# Patient Record
Sex: Female | Born: 1985 | Race: White | Hispanic: No | Smoking: Never smoker
Health system: Southern US, Community
[De-identification: ages and names within clinical notes are randomized; demographics above are authoritative.]

## PROBLEM LIST (undated history)

## (undated) DIAGNOSIS — J45909 Unspecified asthma, uncomplicated: Secondary | ICD-10-CM

## (undated) DIAGNOSIS — T7840XA Allergy, unspecified, initial encounter: Secondary | ICD-10-CM

## (undated) HISTORY — DX: Allergy, unspecified, initial encounter: T78.40XA

## (undated) HISTORY — DX: Unspecified asthma, uncomplicated: J45.909

---

## 2019-05-31 ENCOUNTER — Other Ambulatory Visit: Payer: Self-pay | Admitting: *Deleted

## 2019-05-31 DIAGNOSIS — Z20822 Contact with and (suspected) exposure to covid-19: Secondary | ICD-10-CM

## 2019-06-01 LAB — NOVEL CORONAVIRUS, NAA: SARS-CoV-2, NAA: NOT DETECTED

## 2020-07-07 NOTE — L&D Delivery Note (Signed)
Delivery Note Labor onset: 05/21/2021  Labor Onset Time: 1900 Complete dilation at 11:21 PM  Onset of pushing at 2345 FHR second stage Cat 1 Analgesia/Anesthesia intrapartum: Epidural  Guided pushing with maternal urge. Delivery of a viable female at 81. Fetal head delivered in ROP position.  Nuchal cord: none.  Infant placed on maternal abd, dried, and tactile stim.  Cord double clamped after 3 min and cut by Brenda Estrada, father.  Brenda Estrada and Brenda Estrada (pt's mother) present for birth.  Cord blood sample collected: Yes Arterial cord blood sample collected: No  Placenta delivered Brenda Estrada, intact, with 3 VC.  Placenta to L&D. Uterine tone firm, bleeding brisk, resolved with massage  1st degree laceration identified.  Anesthesia: epidural Repair: 4-0 Vicryl SH QBL/EBL (mL): 250 Complications: none APGAR: APGAR (1 MIN): 9   APGAR (5 MINS): 9   APGAR (10 MINS):   Mom to postpartum.  Baby to Couplet care / Skin to Skin Baby boy "Simonne Come" parents request in-pt circ  Brenda Schanz MSN, CNM 05/22/2021, 12:51 AM

## 2020-09-27 ENCOUNTER — Other Ambulatory Visit: Payer: Self-pay

## 2020-09-27 ENCOUNTER — Ambulatory Visit (INDEPENDENT_AMBULATORY_CARE_PROVIDER_SITE_OTHER): Payer: Medicaid Other

## 2020-09-27 VITALS — BP 120/79 | HR 69 | Ht 70.0 in | Wt 163.0 lb

## 2020-09-27 DIAGNOSIS — Z3A01 Less than 8 weeks gestation of pregnancy: Secondary | ICD-10-CM

## 2020-09-27 DIAGNOSIS — Z3201 Encounter for pregnancy test, result positive: Secondary | ICD-10-CM | POA: Diagnosis not present

## 2020-09-27 LAB — POCT PREGNANCY, URINE: Preg Test, Ur: POSITIVE — AB

## 2020-09-27 NOTE — Progress Notes (Signed)
Pt here today for UPT. UPT positive in office today. Pt aware of results.  LMP 08/06/20, exact  EDD 05/09/21 [redacted]w[redacted]d today based on LMP.   Pt denies any vaginal bleeding, spotting, abd pain or cramps. Pt had nausea and vomiting x 1 yesterday. Pt given pregnancy safe medication list.   Gerrit Heck, CNM in to speak with pt for NEW OB + UPT.   Judeth Cornfield, RN  09/27/20.

## 2020-09-27 NOTE — Progress Notes (Signed)
  History:  [name] is a 35 y.o. G1P0 who presents to clinic today with complaint of amenorrhea. She had a UPT today, in office, that confirms positive and patient reports her Definite LMP was Aug 06, 2020 with EDD of 05/13/2021.  She has questions regarding consumption of seafood and reports some mild nausea.    History reviewed. No pertinent past medical history.  History reviewed. No pertinent surgical history.  The following portions of the patient's history were reviewed and updated as appropriate: allergies, current medications, past family history, past medical history, past social history, past surgical history and problem list.   Review of Systems:  Pertinent items noted in HPI and remainder of comprehensive ROS otherwise negative.  Objective:  Physical Exam BP 120/79   Pulse 69   Ht 5\' 10"  (1.778 m)   Wt 163 lb (73.9 kg)   LMP 08/06/2020 (Exact Date)   BMI 23.39 kg/m  Physical Exam Constitutional:      Appearance: Normal appearance.  HENT:     Head: Normocephalic and atraumatic.  Eyes:     Conjunctiva/sclera: Conjunctivae normal.  Pulmonary:     Effort: Pulmonary effort is normal. No respiratory distress.  Musculoskeletal:        General: Normal range of motion.  Neurological:     Mental Status: She is alert and oriented to person, place, and time.  Psychiatric:        Mood and Affect: Mood normal.        Behavior: Behavior normal.        Thought Content: Thought content normal.     Labs and Imaging Results for orders placed or performed in visit on 09/27/20 (from the past 24 hour(s))  Pregnancy, urine POC     Status: Abnormal   Collection Time: 09/27/20 10:35 AM  Result Value Ref Range   Preg Test, Ur POSITIVE (A) NEGATIVE   No results found.   Assessment & Plan:  1. Positive pregnancy test -Encouraged to start using PNV if not already. -List provided of community [facility] for initiation of care.  2. [redacted] weeks gestation of pregnancy -Encouraged  to eat 3 meals daily and consume high protein snacks between meals. -Informed that consumption of seafood is acceptable in moderation/occasional usage. -Cautioned that primary ob provider may give other recommendations and she should be considerate of those. -Bleeding precautions given   Approximately 15 minutes of total time was spent with this patient on history taking, coordination of care, education and documentation.   09/29/20, CNM 09/27/2020 11:42 AM

## 2020-09-27 NOTE — Patient Instructions (Signed)
Prenatal Care Providers           [facility]  [address] [phone]  [facility]   [address]  [phone]  [facility]       [address] [phone]            [facility]     [address] [phone]          [facility] [place]   [address] [phone]  [facility]  [address] [phone]     [facility]  [address]   [phone]     [facility]  Phone: [phone]  [facility]  Phone: [phone]  [facility] Phone: [phone]  [facility] Phone: [phone]  [facility] Phone: [phone]  [facility] Phone: [phone]  [facility]  Phone: [phone]  

## 2020-10-26 LAB — OB RESULTS CONSOLE HEPATITIS B SURFACE ANTIGEN: Hepatitis B Surface Ag: NEGATIVE

## 2020-10-26 LAB — OB RESULTS CONSOLE RPR: RPR: NONREACTIVE

## 2020-10-26 LAB — OB RESULTS CONSOLE RUBELLA ANTIBODY, IGM: Rubella: IMMUNE

## 2020-10-26 LAB — OB RESULTS CONSOLE ABO/RH: RH Type: POSITIVE

## 2020-10-29 LAB — OB RESULTS CONSOLE GC/CHLAMYDIA
Chlamydia: NEGATIVE
Gonorrhea: NEGATIVE

## 2021-02-18 LAB — OB RESULTS CONSOLE HIV ANTIBODY (ROUTINE TESTING): HIV: NONREACTIVE

## 2021-03-04 ENCOUNTER — Other Ambulatory Visit: Payer: Self-pay

## 2021-03-04 ENCOUNTER — Inpatient Hospital Stay (HOSPITAL_COMMUNITY)
Admission: AD | Admit: 2021-03-04 | Discharge: 2021-03-04 | Disposition: A | Discharge status: Home / Self Care | Payer: Medicaid Other | Attending: Obstetrics and Gynecology | Admitting: Obstetrics and Gynecology

## 2021-03-04 ENCOUNTER — Inpatient Hospital Stay (HOSPITAL_COMMUNITY): Payer: Medicaid Other

## 2021-03-04 ENCOUNTER — Encounter (HOSPITAL_COMMUNITY): Payer: Self-pay | Admitting: Obstetrics and Gynecology

## 2021-03-04 DIAGNOSIS — Z881 Allergy status to other antibiotic agents status: Secondary | ICD-10-CM | POA: Diagnosis not present

## 2021-03-04 DIAGNOSIS — U071 COVID-19: Secondary | ICD-10-CM | POA: Diagnosis not present

## 2021-03-04 DIAGNOSIS — O98513 Other viral diseases complicating pregnancy, third trimester: Secondary | ICD-10-CM | POA: Diagnosis not present

## 2021-03-04 DIAGNOSIS — Z3A30 30 weeks gestation of pregnancy: Secondary | ICD-10-CM | POA: Diagnosis not present

## 2021-03-04 DIAGNOSIS — R059 Cough, unspecified: Secondary | ICD-10-CM | POA: Diagnosis not present

## 2021-03-04 DIAGNOSIS — O09513 Supervision of elderly primigravida, third trimester: Secondary | ICD-10-CM | POA: Diagnosis not present

## 2021-03-04 DIAGNOSIS — O26893 Other specified pregnancy related conditions, third trimester: Secondary | ICD-10-CM | POA: Diagnosis present

## 2021-03-04 DIAGNOSIS — R0602 Shortness of breath: Secondary | ICD-10-CM

## 2021-03-04 DIAGNOSIS — R509 Fever, unspecified: Secondary | ICD-10-CM | POA: Insufficient documentation

## 2021-03-04 DIAGNOSIS — R5383 Other fatigue: Secondary | ICD-10-CM | POA: Insufficient documentation

## 2021-03-04 LAB — COMPREHENSIVE METABOLIC PANEL
ALT: 15 U/L (ref 0–44)
AST: 23 U/L (ref 15–41)
Albumin: 2.9 g/dL — ABNORMAL LOW (ref 3.5–5.0)
Alkaline Phosphatase: 83 U/L (ref 38–126)
Anion gap: 4 — ABNORMAL LOW (ref 5–15)
BUN: <5 mg/dL — ABNORMAL LOW (ref 6–20)
CO2: 26 mmol/L (ref 22–32)
Calcium: 8.3 mg/dL — ABNORMAL LOW (ref 8.9–10.3)
Chloride: 104 mmol/L (ref 98–111)
Creatinine, Ser: 0.58 mg/dL (ref 0.44–1.00)
GFR, Estimated: >60 mL/min (ref >60)
Glucose, Bld: 89 mg/dL (ref 70–99)
Potassium: 3.9 mmol/L (ref 3.5–5.1)
Sodium: 134 mmol/L — ABNORMAL LOW (ref 135–145)
Total Bilirubin: 0.5 mg/dL (ref 0.3–1.2)
Total Protein: 6.3 g/dL — ABNORMAL LOW (ref 6.5–8.1)

## 2021-03-04 LAB — CBC
HCT: 34.7 % — ABNORMAL LOW (ref 36.0–46.0)
Hemoglobin: 11.8 g/dL — ABNORMAL LOW (ref 12.0–15.0)
MCH: 33.3 pg (ref 26.0–34.0)
MCHC: 34 g/dL (ref 30.0–36.0)
MCV: 98 fL (ref 80.0–100.0)
Platelets: 286 10*3/uL (ref 150–400)
RBC: 3.54 MIL/uL — ABNORMAL LOW (ref 3.87–5.11)
RDW: 11.9 % (ref 11.5–15.5)
WBC: 7.8 10*3/uL (ref 4.0–10.5)
nRBC: 0 % (ref 0.0–0.2)

## 2021-03-04 LAB — URINALYSIS, ROUTINE W REFLEX MICROSCOPIC
Bilirubin Urine: NEGATIVE
Glucose, UA: NEGATIVE mg/dL
Hgb urine dipstick: NEGATIVE
Ketones, ur: NEGATIVE mg/dL
Leukocytes,Ua: NEGATIVE
Nitrite: NEGATIVE
Protein, ur: NEGATIVE mg/dL
Specific Gravity, Urine: 1.006 (ref 1.005–1.030)
pH: 7 (ref 5.0–8.0)

## 2021-03-04 LAB — C-REACTIVE PROTEIN: CRP: 0.7 mg/dL (ref <1.0)

## 2021-03-04 LAB — D-DIMER, QUANTITATIVE: D-Dimer, Quant: 0.82 ug/mL-FEU — ABNORMAL HIGH (ref 0.00–0.50)

## 2021-03-04 MED ORDER — ALBUTEROL SULFATE HFA 108 (90 BASE) MCG/ACT IN AERS
2.0000 | INHALATION_SPRAY | Freq: Once | RESPIRATORY_TRACT | Status: DC
  Filled 2021-03-04: qty 6.7

## 2021-03-04 MED ORDER — BENZONATATE 100 MG PO CAPS
200.0000 mg | ORAL_CAPSULE | Freq: Once | ORAL | Status: AC
  Administered 2021-03-04: 200 mg via ORAL
  Filled 2021-03-04: qty 2

## 2021-03-04 MED ORDER — ALBUTEROL SULFATE HFA 108 (90 BASE) MCG/ACT IN AERS: 2.0000 | INHALATION_SPRAY | RESPIRATORY_TRACT | 0 refills | Status: AC | PRN

## 2021-03-04 MED ORDER — DEXAMETHASONE 6 MG PO TABS
6.0000 mg | ORAL_TABLET | Freq: Once | ORAL | Status: AC
  Administered 2021-03-04: 6 mg via ORAL
  Filled 2021-03-04: qty 1

## 2021-03-04 MED ORDER — DEXAMETHASONE 6 MG PO TABS: 6.0000 mg | ORAL_TABLET | Freq: Every day | ORAL | 0 refills | Status: AC

## 2021-03-04 NOTE — MAU Note (Signed)
Pt reports COVID + on 02/27/2021. Pt reports symptoms have not resolved. Pt reports that she is coughing a lot at night and is unable to breathe well or catch her breath during those times. Pt reports she feels like has drainage in her chest and has felt at times like she was going to choke.    Denies vaginal bleeding or LOF.   Reports +FM

## 2021-03-04 NOTE — MAU Provider Note (Signed)
History     CSN: [id number]  Arrival date and time: 03/04/21 1347    No chief complaint on file.  HPI  Is a 35 year old G1 P0 at 30 weeks and 0 days.  Patient here for shortness of breath and COVID-like symptoms.  She tested positive on 8/24 when her symptoms started.  She has been having coughing with some shortness of breath.  Her breathing is worse when trying to lay down flat, especially she feels that there is a lot of drainage from her sinuses.  She has fatigue, diminished appetite, some fevers. No other palliating or provoking factors. Her symptoms are worsening.  OB History     Gravida  1   Para      Term      Preterm      AB      Living         SAB      IAB      Ectopic      Multiple      Live Births              History reviewed. No pertinent past medical history.  History reviewed. No pertinent surgical history.  History reviewed. No pertinent family history.  Social History   Tobacco Use   Smoking status: Never   Smokeless tobacco: Never  Vaping Use   Vaping Use: Never used  Substance Use Topics   Alcohol use: Never   Drug use: Never    Allergies:  Allergies  Allergen Reactions   Erythromycin Hives    Also vomiting     No medications prior to admission.    Review of Systems  All other systems reviewed and are negative. Physical Exam   Temperature 98.9 F (37.2 C), resp. rate 12, last menstrual period 08/06/2020, SpO2 95 %.  Physical Exam Vitals and nursing note reviewed.  Constitutional:      Appearance: Normal appearance.  Cardiovascular:     Rate and Rhythm: Normal rate and regular rhythm.  Pulmonary:     Effort: Pulmonary effort is normal.     Breath sounds: Normal breath sounds.  Abdominal:     General: Abdomen is flat.     Palpations: Abdomen is soft.  Skin:    General: Skin is warm and dry.     Capillary Refill: Capillary refill takes less than 2 seconds.  Neurological:     General: No focal deficit  present.     Mental Status: She is alert.  Psychiatric:        Mood and Affect: Mood normal.        Behavior: Behavior normal.        Thought Content: Thought content normal.   Results for orders placed or performed during the hospital encounter of 03/04/21 (from the past 24 hour(s))  Comprehensive metabolic panel     Status: Abnormal   Collection Time: 03/04/21  2:02 PM  Result Value Ref Range   Sodium 134 (L) 135 - 145 mmol/L   Potassium 3.9 3.5 - 5.1 mmol/L   Chloride 104 98 - 111 mmol/L   CO2 26 22 - 32 mmol/L   Glucose, Bld 89 70 - 99 mg/dL   BUN <5 (L) 6 - 20 mg/dL   Creatinine, Ser 5.17 0.44 - 1.00 mg/dL   Calcium 8.3 (L) 8.9 - 10.3 mg/dL   Total Protein 6.3 (L) 6.5 - 8.1 g/dL   Albumin 2.9 (L) 3.5 - 5.0 g/dL   AST 23 15 -  41 U/L   ALT 15 0 - 44 U/L   Alkaline Phosphatase 83 38 - 126 U/L   Total Bilirubin 0.5 0.3 - 1.2 mg/dL   GFR, Estimated >20 >25 mL/min   Anion gap 4 (L) 5 - 15  CBC     Status: Abnormal   Collection Time: 03/04/21  2:02 PM  Result Value Ref Range   WBC 7.8 4.0 - 10.5 K/uL   RBC 3.54 (L) 3.87 - 5.11 MIL/uL   Hemoglobin 11.8 (L) 12.0 - 15.0 g/dL   HCT 42.7 (L) 06.2 - 37.6 %   MCV 98.0 80.0 - 100.0 fL   MCH 33.3 26.0 - 34.0 pg   MCHC 34.0 30.0 - 36.0 g/dL   RDW 28.3 15.1 - 76.1 %   Platelets 286 150 - 400 K/uL   nRBC 0.0 0.0 - 0.2 %  C-reactive protein     Status: None   Collection Time: 03/04/21  2:02 PM  Result Value Ref Range   CRP 0.7 <1.0 mg/dL  D-dimer, quantitative     Status: Abnormal   Collection Time: 03/04/21  2:02 PM  Result Value Ref Range   D-Dimer, Quant 0.82 (H) 0.00 - 0.50 ug/mL-FEU  Urinalysis, Routine w reflex microscopic Urine, Clean Catch     Status: None   Collection Time: 03/04/21  2:49 PM  Result Value Ref Range   Color, Urine YELLOW YELLOW   APPearance CLEAR CLEAR   Specific Gravity, Urine 1.006 1.005 - 1.030   pH 7.0 5.0 - 8.0   Glucose, UA NEGATIVE NEGATIVE mg/dL   Hgb urine dipstick NEGATIVE NEGATIVE    Bilirubin Urine NEGATIVE NEGATIVE   Ketones, ur NEGATIVE NEGATIVE mg/dL   Protein, ur NEGATIVE NEGATIVE mg/dL   Nitrite NEGATIVE NEGATIVE   Leukocytes,Ua NEGATIVE NEGATIVE    DG CHEST PORT 1 VIEW  Result Date: 03/04/2021 CLINICAL DATA:  Shortness of breath, COVID-19 positive. EXAM: PORTABLE CHEST 1 VIEW COMPARISON:  None. FINDINGS: The heart size and mediastinal contours are within normal limits. Both lungs are clear. The visualized skeletal structures are unremarkable. IMPRESSION: No active disease. Electronically Signed   By: Lupita Raider M.D.   On: 03/04/2021 14:43     MAU Course  Procedures NST: Baseline 140, moderate variability, no accels, no decels  MDM CXR reviewed by me - no acute cardiopulmonary process. Decadron given.  Assessment and Plan   1. [redacted] weeks gestation of pregnancy   2. SOB (shortness of breath)   3. COVID-19   4. Acute COVID-19    Discharge to home. 10 days of decadron due to SOB. Will also give HFA. Patient at end of range for Paxlovid - it was offered, but she declined it.   Return for worsening SOB, unable to take medications, decreased fetal movement.  Levie Heritage 03/04/2021, 4:26 PM

## 2021-04-15 LAB — OB RESULTS CONSOLE GBS: GBS: NEGATIVE

## 2021-05-09 ENCOUNTER — Other Ambulatory Visit: Payer: Self-pay | Admitting: Obstetrics & Gynecology

## 2021-05-13 ENCOUNTER — Inpatient Hospital Stay (HOSPITAL_COMMUNITY): Admit: 2021-05-13 | Payer: Self-pay

## 2021-05-19 NOTE — H&P (Signed)
Konica Stankowski is a 35 y.o. female, G1P0000, IUP at 41 weeks, presenting for IOL for postdates.  Pt endorse + Fm. Denies vaginal leakage. Denies vaginal bleeding. Denies feeling cxt's.    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 Patient Active Problem List   Diagnosis Date Noted   Post-dates pregnancy 05/20/2021     Active Ambulatory Problems    Diagnosis Date Noted   No Active Ambulatory Problems   Resolved Ambulatory Problems    Diagnosis Date Noted   No Resolved Ambulatory Problems   No Additional Past Medical History      Medications Prior to Admission  Medication Sig Dispense Refill Last Dose   cholecalciferol (VITAMIN D3) 25 MCG (1000 UNIT) tablet Take 1,000 Units by mouth daily.   05/20/2021   Prenatal Vit-Fe Fumarate-FA (PRENATAL MULTIVITAMIN) TABS tablet Take 1 tablet by mouth daily at 12 noon.   05/20/2021   albuterol (VENTOLIN HFA) 108 (90 Base) MCG/ACT inhaler Inhale 2 puffs into the lungs every 4 (four) hours as needed for wheezing or shortness of breath. 18 g 0     History reviewed. No pertinent past medical history.   No current facility-administered medications on file prior to encounter.   Current Outpatient Medications on File Prior to Encounter  Medication Sig Dispense Refill   cholecalciferol (VITAMIN D3) 25 MCG (1000 UNIT) tablet Take 1,000 Units by mouth daily.     Prenatal Vit-Fe Fumarate-FA (PRENATAL MULTIVITAMIN) TABS tablet Take 1 tablet by mouth daily at 12 noon.     albuterol (VENTOLIN HFA) 108 (90 Base) MCG/ACT inhaler Inhale 2 puffs into the lungs every 4 (four) hours as needed for wheezing or shortness of breath. 18 g 0     Allergies  Allergen Reactions   Erythromycin Hives    Also vomiting     History of  present pregnancy: Pt Info/Preference:  Screening/Consents:  Labs:   EDD: Estimated Date of Delivery: 05/13/21  Establised: Patient's last menstrual period was 08/06/2020 (exact date).  Anatomy Scan: Date: 12/25/2020 Placenta Location: anterior Genetic Screen: Panoroma:LR female AFP:  First Tri: Quad:  Office: [facility]             First PNV: 11.4 weeks Blood Type --/--/B POS (11/14 1603)B+  Language: english Last PNV: 40.1 weeks Rhogam  N/A  Flu Vaccine:  declined   Antibody NEG (11/14 1603)Neg  TDaP vaccine UTD   GTT: Early: 501 Third Trimester: 96  Feeding Plan: breast BTL: no Rubella: Immune (04/22 0000)Immune  Contraception: ??? VBAC: no RPR: Nonreactive (04/22 0000) NR  Circumcision: ???   HBsAg: Negative (04/22 0000)Neg  Pediatrician:  ???   HIV: Non-reactive (08/15 0000) Neg  Prenatal Classes: No Additional [place]: 11/8 NST reactive, 10/10 vertex, anterior, AFI 15.1cm, 7.4lbs 91% GBS: Negative/-- (10/10 0000)04/15/2021 Negative (For PCN allergy, check sensitivities)       Chlamydia: neg    MFM Referral/Consult:  GC: neg  Support Person: husband   PAP: ???  Pain Management: epidural Neonatologist Referral:  Hgb Electrophoresis:  AA  Birth Plan: DCC   Hgb NOB: 12.9    28W: 11.5   OB History     Gravida  1   Para      Term      Preterm      AB  Living         SAB      IAB      Ectopic      Multiple      Live Births             History reviewed. No pertinent past medical history. History reviewed. No pertinent surgical history. Family History: family history is not on file. Social History:  reports that she has never smoked. She has never used smokeless tobacco. She reports that she does not drink alcohol and does not use drugs.   Prenatal Transfer Tool  Maternal Diabetes: No Genetic Screening: Normal Maternal Ultrasounds/Referrals: Normal Fetal Ultrasounds or other Referrals:  None Maternal Substance Abuse:  No Significant Maternal Medications:   None Significant Maternal Lab Results: Group B Strep negative  ROS:  Review of Systems  Constitutional: Negative.   HENT: Negative.    Eyes: Negative.   Respiratory: Negative.    Cardiovascular: Negative.   Gastrointestinal: Negative.   Genitourinary: Negative.   Musculoskeletal: Negative.   Skin: Negative.   Neurological: Negative.   Endo/Heme/Allergies: Negative.   Psychiatric/Behavioral: Negative.      Physical Exam: BP 112/69   Pulse 68   Temp 97.6 F (36.4 C) (Oral)   Resp 16   Ht 5\' 10"  (1.778 m)   Wt 98.7 kg   LMP 08/06/2020 (Exact Date)   BMI 31.22 kg/m   Physical Exam Vitals and nursing note reviewed.  Constitutional:      Appearance: Normal appearance.  HENT:     Head: Normocephalic.     Nose: Nose normal.     Mouth/Throat:     Mouth: Mucous membranes are moist.  Eyes:     Conjunctiva/sclera: Conjunctivae normal.  Cardiovascular:     Rate and Rhythm: Normal rate and regular rhythm.     Pulses: Normal pulses.     Heart sounds: Normal heart sounds.  Pulmonary:     Effort: Pulmonary effort is normal.     Breath sounds: Normal breath sounds.  Abdominal:     General: Bowel sounds are normal.  Genitourinary:    Comments: Uterus gravida, pelvis adequate for vaginal delivery  Musculoskeletal:        General: Normal range of motion.     Cervical back: Normal range of motion and neck supple.  Skin:    General: Skin is warm.     Capillary Refill: Capillary refill takes less than 2 seconds.  Neurological:     General: No focal deficit present.     Mental Status: She is alert.  Psychiatric:        Mood and Affect: Mood normal.     NST: FHR baseline 150 bpm, Variability: moderate, Accelerations:present, Decelerations:  Absent= Cat 1/Reactive UC:   OCC SVE:   Dilation: Fingertip Effacement (%): Thick Station: -3 Exam by:: [email or web address] RN, vertex verified by fetal sutures.  Leopold's: Position vertex, EFW 9lbs via leopold's.   Labs: Results for  orders placed or performed during the hospital encounter of 05/20/21 (from the past 24 hour(s))  CBC     Status: Abnormal   Collection Time: 05/20/21  4:03 PM  Result Value Ref Range   WBC 11.0 (H) 4.0 - 10.5 K/uL   RBC 3.40 (L) 3.87 - 5.11 MIL/uL   Hemoglobin 11.2 (L) 12.0 - 15.0 g/dL   HCT 05/22/21 (L) 60.7 - 37.1 %   MCV 97.1 80.0 - 100.0 fL   MCH 32.9 26.0 - 34.0 pg   MCHC  33.9 30.0 - 36.0 g/dL   RDW 57.8 46.9 - 62.9 %   Platelets 309 150 - 400 K/uL   nRBC 0.0 0.0 - 0.2 %  Type and screen     Status: None   Collection Time: 05/20/21  4:03 PM  Result Value Ref Range   ABO/RH(D) B POS    Antibody Screen NEG    Sample Expiration      05/23/2021,2359 Performed at [facility], [address] [place], [place] [address]     Imaging:  Office [place] 04/15/21: Vertex presentation normal AFI EFW 3297 grams 7 # 4 oz 91%   Assessment/Plan: Kenyanna Grzesiak is a 35 y.o. female, G1P0000, IUP at 41 weeks, presenting for IOL for postdates. Pt is AMA (35 yo, LR panoroma), ankylosis spondylitis, asthma albuterol PRN, LGA, vit D def 19.3 recommended 4000IU daily, chiari malformation (Has imaging, Chiari 1. No issues.). EFW 91% 7.6 lbs at 36 weeks. Pt endorse + Fm. Denies vaginal leakage. Denies vaginal bleeding. Denies feeling cxt's.  FWB: Cat 1 Fetal Tracing.   I discussed with patient risks, benefits and alternatives of labor induction including higher risk of cesarean delivery compared to spontaneous labor.  We discussed risks of induction agents including effects on fetal heart beat, contraction pattern and need for close monitoring.  Patient expressed understanding of all this and desired to proceed with the induction. Risks and benefits of induction were reviewed, including failure of method, prolonged labor, need for further intervention, risk of cesarean.  Patient and family verbalized understanding and denies any further questions at this time. Pt and family wish to proceed with induction  process. Discussed induction options of Cytotec, foley bulb, AROM, and pitocin were reviewed as well as risks and benefits with use of each discussed.  Plan: Admit to Birthing Suite  Routine [facility] orders Pain med/epidural prn Start with Cytotec for cervical ripening.  Anticipate labor progression   Essie Hart MD 05/20/2021, 1736 PM

## 2021-05-20 ENCOUNTER — Encounter (HOSPITAL_COMMUNITY): Payer: Self-pay | Admitting: Obstetrics & Gynecology

## 2021-05-20 ENCOUNTER — Other Ambulatory Visit: Payer: Self-pay

## 2021-05-20 ENCOUNTER — Inpatient Hospital Stay (HOSPITAL_COMMUNITY): Payer: Medicaid Other

## 2021-05-20 ENCOUNTER — Inpatient Hospital Stay (HOSPITAL_COMMUNITY)
Admission: AD | Admit: 2021-05-20 | Discharge: 2021-05-24 | DRG: 805 | Disposition: A | Discharge status: Home / Self Care | Payer: Medicaid Other | Attending: Obstetrics & Gynecology | Admitting: Obstetrics & Gynecology

## 2021-05-20 DIAGNOSIS — O480 Post-term pregnancy: Principal | ICD-10-CM | POA: Diagnosis present

## 2021-05-20 DIAGNOSIS — M459 Ankylosing spondylitis of unspecified sites in spine: Secondary | ICD-10-CM | POA: Diagnosis present

## 2021-05-20 DIAGNOSIS — Z20822 Contact with and (suspected) exposure to covid-19: Secondary | ICD-10-CM | POA: Diagnosis present

## 2021-05-20 DIAGNOSIS — O99354 Diseases of the nervous system complicating childbirth: Secondary | ICD-10-CM | POA: Diagnosis present

## 2021-05-20 DIAGNOSIS — G935 Compression of brain: Secondary | ICD-10-CM | POA: Diagnosis present

## 2021-05-20 DIAGNOSIS — O99892 Other specified diseases and conditions complicating childbirth: Secondary | ICD-10-CM | POA: Diagnosis present

## 2021-05-20 DIAGNOSIS — J45909 Unspecified asthma, uncomplicated: Secondary | ICD-10-CM | POA: Diagnosis present

## 2021-05-20 DIAGNOSIS — O3663X0 Maternal care for excessive fetal growth, third trimester, not applicable or unspecified: Secondary | ICD-10-CM | POA: Diagnosis present

## 2021-05-20 DIAGNOSIS — Z3A41 41 weeks gestation of pregnancy: Secondary | ICD-10-CM

## 2021-05-20 DIAGNOSIS — O9952 Diseases of the respiratory system complicating childbirth: Secondary | ICD-10-CM | POA: Diagnosis present

## 2021-05-20 LAB — CBC
HCT: 33 % — ABNORMAL LOW (ref 36.0–46.0)
Hemoglobin: 11.2 g/dL — ABNORMAL LOW (ref 12.0–15.0)
MCH: 32.9 pg (ref 26.0–34.0)
MCHC: 33.9 g/dL (ref 30.0–36.0)
MCV: 97.1 fL (ref 80.0–100.0)
Platelets: 309 10*3/uL (ref 150–400)
RBC: 3.4 MIL/uL — ABNORMAL LOW (ref 3.87–5.11)
RDW: 11.9 % (ref 11.5–15.5)
WBC: 11 10*3/uL — ABNORMAL HIGH (ref 4.0–10.5)
nRBC: 0 % (ref 0.0–0.2)

## 2021-05-20 LAB — RAPID HIV SCREEN (HIV 1/2 AB+AG)
HIV 1/2 Antibodies: NONREACTIVE
HIV-1 P24 Antigen - HIV24: NONREACTIVE

## 2021-05-20 LAB — TYPE AND SCREEN
ABO/RH(D): B POS
Antibody Screen: NEGATIVE

## 2021-05-20 LAB — RESP PANEL BY RT-PCR (FLU A&B, COVID) ARPGX2
Influenza A by PCR: NEGATIVE
Influenza B by PCR: NEGATIVE
SARS Coronavirus 2 by RT PCR: NEGATIVE

## 2021-05-20 MED ORDER — FENTANYL CITRATE (PF) 100 MCG/2ML IJ SOLN
50.0000 ug | INTRAMUSCULAR | Status: DC | PRN
Start: 2021-05-20 — End: 2021-05-22
  Administered 2021-05-21 (×2): 100 ug via INTRAVENOUS
  Filled 2021-05-20 (×2): qty 2

## 2021-05-20 MED ORDER — SOD CITRATE-CITRIC ACID 500-334 MG/5ML PO SOLN: 30.0000 mL | ORAL | Status: DC | PRN

## 2021-05-20 MED ORDER — LACTATED RINGERS IV SOLN
500.0000 mL | INTRAVENOUS | Status: DC | PRN
Start: 2021-05-20 — End: 2021-05-22

## 2021-05-20 MED ORDER — OXYTOCIN-SODIUM CHLORIDE 30-0.9 UT/500ML-% IV SOLN
2.5000 [IU]/h | INTRAVENOUS | Status: DC
  Administered 2021-05-22: 2.5 [IU]/h via INTRAVENOUS

## 2021-05-20 MED ORDER — TERBUTALINE SULFATE 1 MG/ML IJ SOLN: 0.2500 mg | Freq: Once | INTRAMUSCULAR | Status: DC | PRN

## 2021-05-20 MED ORDER — OXYTOCIN BOLUS FROM INFUSION
333.0000 mL | Freq: Once | INTRAVENOUS | Status: AC
  Administered 2021-05-22: 333 mL via INTRAVENOUS

## 2021-05-20 MED ORDER — LIDOCAINE HCL (PF) 1 % IJ SOLN: 30.0000 mL | INTRAMUSCULAR | Status: DC | PRN

## 2021-05-20 MED ORDER — LACTATED RINGERS IV SOLN: INTRAVENOUS | Status: DC

## 2021-05-20 MED ORDER — MISOPROSTOL 25 MCG QUARTER TABLET
25.0000 ug | ORAL_TABLET | ORAL | Status: DC | PRN
  Administered 2021-05-20: 25 ug via VAGINAL
  Filled 2021-05-20 (×2): qty 1

## 2021-05-20 MED ORDER — ACETAMINOPHEN 325 MG PO TABS: 650.0000 mg | ORAL_TABLET | ORAL | Status: DC | PRN

## 2021-05-20 MED ORDER — ONDANSETRON HCL 4 MG/2ML IJ SOLN: 4.0000 mg | Freq: Four times a day (QID) | INTRAMUSCULAR | Status: DC | PRN

## 2021-05-20 NOTE — Progress Notes (Signed)
Subjective:    Reviewed birth plan and discussed induction methods and pain management options. Questions answered.   Objective:    VS: BP 119/77   Pulse 66   Temp 98 F (36.7 C) (Oral)   Resp 16   Ht 5\' 10"  (1.778 m)   Wt 98.7 kg   LMP 08/06/2020 (Exact Date)   BMI 31.22 kg/m  FHR : baseline 145 / variability moderate / accelerations present / absent decelerations Toco: contractions irregular Membranes: intact Dilation: 2 Effacement (%): 70 Station: -2 Exam by:: [email or web address] CNM  Assessment/Plan:   35 y.o. G1P0 [redacted]w[redacted]d IOL for dates Hx-    AMA     ankylosis spondylitis    asthma albuterol PRN,     Large for gestational age fetus    vit D deficient    chiari malformation type 1 Had MRI, No issues  Labor:  S/P Cytotec x1, Cook balloon placed w/ 80 ml uterine side  and 60 mL vaginal side. Pt tolerated well Fetal Wellbeing:  Category I Pain Control:   options discussed I/D:   GBS neg Anticipated MOD:  NSVD  [redacted]w[redacted]d MSN, CNM 05/20/2021 9:20 PM

## 2021-05-21 ENCOUNTER — Inpatient Hospital Stay (HOSPITAL_COMMUNITY): Payer: Medicaid Other | Admitting: Anesthesiology

## 2021-05-21 ENCOUNTER — Encounter (HOSPITAL_COMMUNITY): Payer: Self-pay | Admitting: Obstetrics & Gynecology

## 2021-05-21 LAB — RPR: RPR Ser Ql: NONREACTIVE

## 2021-05-21 MED ORDER — TERBUTALINE SULFATE 1 MG/ML IJ SOLN: 0.2500 mg | Freq: Once | INTRAMUSCULAR | Status: DC | PRN

## 2021-05-21 MED ORDER — EPHEDRINE 5 MG/ML INJ: 10.0000 mg | INTRAVENOUS | Status: DC | PRN

## 2021-05-21 MED ORDER — DIPHENHYDRAMINE HCL 50 MG/ML IJ SOLN: 12.5000 mg | INTRAMUSCULAR | Status: DC | PRN

## 2021-05-21 MED ORDER — OXYTOCIN-SODIUM CHLORIDE 30-0.9 UT/500ML-% IV SOLN
1.0000 m[IU]/min | INTRAVENOUS | Status: DC
  Administered 2021-05-21: 2 m[IU]/min via INTRAVENOUS
  Filled 2021-05-21: qty 500

## 2021-05-21 MED ORDER — PHENYLEPHRINE 40 MCG/ML (10ML) SYRINGE FOR IV PUSH (FOR BLOOD PRESSURE SUPPORT)
80.0000 ug | PREFILLED_SYRINGE | INTRAVENOUS | Status: DC | PRN
  Filled 2021-05-21: qty 10

## 2021-05-21 MED ORDER — LIDOCAINE HCL (PF) 1 % IJ SOLN
INTRAMUSCULAR | Status: DC | PRN
  Administered 2021-05-21: 10 mL via EPIDURAL

## 2021-05-21 MED ORDER — FENTANYL-BUPIVACAINE-NACL 0.5-0.125-0.9 MG/250ML-% EP SOLN
12.0000 mL/h | EPIDURAL | Status: DC | PRN
  Administered 2021-05-21: 12 mL/h via EPIDURAL
  Filled 2021-05-21: qty 250

## 2021-05-21 MED ORDER — LACTATED RINGERS IV SOLN
500.0000 mL | Freq: Once | INTRAVENOUS | Status: AC
  Administered 2021-05-21: 500 mL via INTRAVENOUS

## 2021-05-21 MED ORDER — PHENYLEPHRINE 40 MCG/ML (10ML) SYRINGE FOR IV PUSH (FOR BLOOD PRESSURE SUPPORT): 80.0000 ug | PREFILLED_SYRINGE | INTRAVENOUS | Status: DC | PRN

## 2021-05-21 MED ORDER — FENTANYL-BUPIVACAINE-NACL 0.5-0.125-0.9 MG/250ML-% EP SOLN: 12.0000 mL/h | EPIDURAL | Status: DC | PRN

## 2021-05-21 NOTE — Progress Notes (Signed)
Fetal surveillance reviewed while on unit. Cat 1 tracing.   Rhea Pink, MSN, CNM 05/21/2021 2:59 AM

## 2021-05-21 NOTE — Anesthesia Preprocedure Evaluation (Signed)
Anesthesia Evaluation  Patient identified by MRN, date of birth, ID band Patient awake    Reviewed: Allergy & Precautions, H&P , NPO status , Patient's Chart, lab work & pertinent test results, reviewed documented beta blocker date and time   Airway Mallampati: II  TM Distance: >3 FB Neck ROM: full    Dental no notable dental hx. (+) Teeth Intact, Dental Advisory Given   Pulmonary asthma ,    Pulmonary exam normal breath sounds clear to auscultation       Cardiovascular negative cardio ROS Normal cardiovascular exam Rhythm:regular Rate:Normal     Neuro/Psych Chiari malformation  negative neurological ROS  negative psych ROS   GI/Hepatic negative GI ROS, Neg liver ROS,   Endo/Other  negative endocrine ROS  Renal/GU negative Renal ROS  negative genitourinary   Musculoskeletal   Abdominal   Peds  Hematology negative hematology ROS (+)   Anesthesia Other Findings   Reproductive/Obstetrics (+) Pregnancy                             Anesthesia Physical Anesthesia Plan  ASA: 2  Anesthesia Plan: Epidural   Post-op Pain Management:    Induction:   PONV Risk Score and Plan: 2 and Treatment may vary due to age or medical condition  Airway Management Planned: Natural Airway  Additional Equipment: None  Intra-op Plan:   Post-operative Plan:   Informed Consent: I have reviewed the patients History and Physical, chart, labs and discussed the procedure including the risks, benefits and alternatives for the proposed anesthesia with the patient or authorized representative who has indicated his/her understanding and acceptance.       Plan Discussed with: Anesthesiologist and CRNA  Anesthesia Plan Comments:         Anesthesia Quick Evaluation

## 2021-05-21 NOTE — Progress Notes (Signed)
Subjective:    Pt reports a restless night w/ mild discomfort from cervical balloon. Doing better now that balloon was removed. Discussed amniotomy and Pitocin and pt agrees to amniotomy now and Pitocin after one hour. Pt to have a light breakfast before Pitocin. Position change and freedom of movement have been useful for pain management and pt plans to use nitrous as her next option.   Objective:    VS: BP 117/73   Pulse 70   Temp 98.1 F (36.7 C) (Oral)   Resp 18   Ht 5\' 10"  (1.778 m)   Wt 98.7 kg   LMP 08/06/2020 (Exact Date)   BMI 31.22 kg/m  FHR : baseline 135 / variability moderate / accelerations present / early decelerations Toco: contractions every 4-5 Membranes: AROM, clear, scant Dilation: 4 Effacement (%): 80 Station: 0 Presentation: Vertex Exam by:: [email or web address], CNM   Assessment/Plan:   35 y.o. G1P0 [redacted]w[redacted]d IOL for dates Hx AMA     ankylosis spondylitis    asthma albuterol PRN,     Large for gestational age fetus    vit D deficient    chiari malformation type 1 Had MRI, No issues  Labor:  S/P Cytotec x1 dose, cook balloon x 12 hrs, AROM, will start Pitocin @ 1100 Fetal Wellbeing:  Category I Pain Control:   position change I/D:   GBS neg Anticipated MOD:  NSVD  [redacted]w[redacted]d MSN, CNM 05/21/2021 10:04 AM

## 2021-05-21 NOTE — Progress Notes (Signed)
Informed by RN that pt requested additional rest time before starting Pitocin. Pt now agrees to Pitocin and RN will start per orders. Dr. Sallye Ober to be updated on change in plan.   Rhea Pink, MSN, CNM 05/21/2021 12:30 PM

## 2021-05-21 NOTE — Anesthesia Procedure Notes (Signed)
Epidural Patient location during procedure: OB Start time: 05/21/2021 4:19 PM End time: 05/21/2021 4:25 PM  Staffing Anesthesiologist: Bethena Midget, MD  Preanesthetic Checklist Completed: patient identified, IV checked, site marked, risks and benefits discussed, surgical consent, monitors and equipment checked, pre-op evaluation and timeout performed  Epidural Patient position: sitting Prep: DuraPrep and site prepped and draped Patient monitoring: continuous pulse ox and blood pressure Approach: midline Location: L3-L4 Injection technique: LOR air  Needle:  Needle type: Tuohy  Needle gauge: 17 G Needle length: 9 cm and 9 Needle insertion depth: 6 cm Catheter type: closed end flexible Catheter size: 19 Gauge Catheter at skin depth: 11 cm Test dose: negative  Assessment Events: blood not aspirated, injection not painful, no injection resistance, no paresthesia and negative IV test

## 2021-05-21 NOTE — Progress Notes (Signed)
Subjective:    Sitting on ball and coping well with ctx.  Objective:    VS: BP 120/69   Pulse 67   Temp 98 F (36.7 C) (Oral)   Resp 16   Ht 5\' 10"  (1.778 m)   Wt 98.7 kg   LMP 08/06/2020 (Exact Date)   BMI 31.22 kg/m  FHR : baseline 130 / variability moderate / accelerations present / absent decelerations Toco: contractions every 2-4 minutes  Membranes: intact Dilation: 2 Effacement (%): 70 Station: -2 Exam by:: [email or web address] CNM   Assessment/Plan:   35 y.o. G1P0 [redacted]w[redacted]d IOL for dates [facility] balloon-traction applied, remains in place Cat 1  [facility] MSN, CNM 05/21/2021 12:42 AM

## 2021-05-21 NOTE — Progress Notes (Signed)
Subjective:    Coping well w/ ctx. Position changes and support from RN and CNM are effective, but is now requesting IV pain meds.   Objective:    VS: BP 119/72   Pulse 70   Temp 98.1 F (36.7 C) (Oral)   Resp 16   Ht 5\' 10"  (1.778 m)   Wt 98.7 kg   LMP 08/06/2020 (Exact Date)   BMI 31.22 kg/m  FHR : baseline 140 / variability moderate / accelerations present / absent decelerations Toco: contractions every 2-4 minutes  Membranes: AROM x6 hrs, remains clear Dilation: 5.5 Effacement (%): 80 Station: 0 Presentation: Vertex Exam by:: [email or web address], CNM Pitocin 4 mU/min  Assessment/Plan:   35 y.o. G1P0 [redacted]w[redacted]d  Labor: Progressing normally Fetal Wellbeing:  Category I Pain Control:  IV pain meds I/D:   GBS neg Anticipated MOD:  NSVD  [redacted]w[redacted]d MSN, CNM 05/21/2021 3:20 PM

## 2021-05-21 NOTE — Progress Notes (Signed)
Subjective:    Comfortable w/ epidural. Agrees to IUPC placement and increasing Pitocin now that she has an epidural placed.   Objective:    VS: BP (!) 99/58   Pulse 75   Temp 97.9 F (36.6 C) (Oral)   Resp 16   Ht 5\' 10"  (1.778 m)   Wt 98.7 kg   LMP 08/06/2020 (Exact Date)   SpO2 97%   BMI 31.22 kg/m  FHR : baseline 145 / variability moderate / accelerations present / absent decelerations IUPC: contractions every 2-3 minutes / MVU 110 Membranes: AROM x10 hrs, light mec Dilation: 6 Effacement (%): 80 Station: Plus 1 Presentation: Vertex Exam by:: [email or web address], CNM Pitocin 4 mU/min  Assessment/Plan:   35 y.o. G1P0 [redacted]w[redacted]d IOL for dates  Labor:  S/P Cytotec x1, cervical balloon x12 hrs, AROM, and then Pitocin.  Fetal Wellbeing:  Category I Pain Control:  Epidural I/D:   GBS neg Anticipated MOD:  NSVD  [redacted]w[redacted]d MSN, CNM 05/21/2021 7:07 PM

## 2021-05-22 ENCOUNTER — Encounter (HOSPITAL_COMMUNITY): Payer: Self-pay | Admitting: Obstetrics & Gynecology

## 2021-05-22 LAB — CBC
HCT: 27.4 % — ABNORMAL LOW (ref 36.0–46.0)
Hemoglobin: 9.5 g/dL — ABNORMAL LOW (ref 12.0–15.0)
MCH: 33.3 pg (ref 26.0–34.0)
MCHC: 34.7 g/dL (ref 30.0–36.0)
MCV: 96.1 fL (ref 80.0–100.0)
Platelets: 264 10*3/uL (ref 150–400)
RBC: 2.85 MIL/uL — ABNORMAL LOW (ref 3.87–5.11)
RDW: 12 % (ref 11.5–15.5)
WBC: 22.9 10*3/uL — ABNORMAL HIGH (ref 4.0–10.5)
nRBC: 0 % (ref 0.0–0.2)

## 2021-05-22 MED ORDER — BENZOCAINE-MENTHOL 20-0.5 % EX AERO
1.0000 "application " | INHALATION_SPRAY | CUTANEOUS | Status: DC | PRN
  Administered 2021-05-22 – 2021-05-23 (×2): 1 via TOPICAL
  Filled 2021-05-22 (×2): qty 56

## 2021-05-22 MED ORDER — PRENATAL MULTIVITAMIN CH
1.0000 | ORAL_TABLET | Freq: Every day | ORAL | Status: DC
  Administered 2021-05-22 – 2021-05-23 (×2): 1 via ORAL
  Filled 2021-05-22 (×2): qty 1

## 2021-05-22 MED ORDER — DIPHENHYDRAMINE HCL 25 MG PO CAPS: 25.0000 mg | ORAL_CAPSULE | Freq: Four times a day (QID) | ORAL | Status: DC | PRN

## 2021-05-22 MED ORDER — ONDANSETRON HCL 4 MG/2ML IJ SOLN: 4.0000 mg | INTRAMUSCULAR | Status: DC | PRN

## 2021-05-22 MED ORDER — ALBUTEROL SULFATE (2.5 MG/3ML) 0.083% IN NEBU: 3.0000 mL | INHALATION_SOLUTION | RESPIRATORY_TRACT | Status: DC | PRN

## 2021-05-22 MED ORDER — DIBUCAINE (PERIANAL) 1 % EX OINT: 1.0000 "application " | TOPICAL_OINTMENT | CUTANEOUS | Status: DC | PRN

## 2021-05-22 MED ORDER — ONDANSETRON HCL 4 MG PO TABS: 4.0000 mg | ORAL_TABLET | ORAL | Status: DC | PRN

## 2021-05-22 MED ORDER — COCONUT OIL OIL
1.0000 "application " | TOPICAL_OIL | Status: DC | PRN
  Administered 2021-05-22: 1 via TOPICAL

## 2021-05-22 MED ORDER — WITCH HAZEL-GLYCERIN EX PADS: 1.0000 "application " | MEDICATED_PAD | CUTANEOUS | Status: DC | PRN

## 2021-05-22 MED ORDER — TETANUS-DIPHTH-ACELL PERTUSSIS 5-2.5-18.5 LF-MCG/0.5 IM SUSY: 0.5000 mL | PREFILLED_SYRINGE | Freq: Once | INTRAMUSCULAR | Status: DC

## 2021-05-22 MED ORDER — SIMETHICONE 80 MG PO CHEW: 80.0000 mg | CHEWABLE_TABLET | ORAL | Status: DC | PRN

## 2021-05-22 MED ORDER — IBUPROFEN 600 MG PO TABS
600.0000 mg | ORAL_TABLET | Freq: Four times a day (QID) | ORAL | Status: DC
  Administered 2021-05-22 – 2021-05-24 (×9): 600 mg via ORAL
  Filled 2021-05-22 (×9): qty 1

## 2021-05-22 MED ORDER — ACETAMINOPHEN 325 MG PO TABS
650.0000 mg | ORAL_TABLET | ORAL | Status: DC | PRN
  Administered 2021-05-22: 650 mg via ORAL
  Filled 2021-05-22: qty 2

## 2021-05-22 MED ORDER — SENNOSIDES-DOCUSATE SODIUM 8.6-50 MG PO TABS
2.0000 | ORAL_TABLET | ORAL | Status: DC
  Administered 2021-05-22 – 2021-05-24 (×3): 2 via ORAL
  Filled 2021-05-22 (×3): qty 2

## 2021-05-22 NOTE — Lactation Note (Signed)
This note was copied from a baby's chart. Lactation Consultation Note  Patient Name: Brenda Estrada Date: 05/22/2021 Reason for consult: Follow-up assessment;Primapara;1st time breastfeeding;Term Age:36 hours/ baby last breast fed at 1242 for 10 mins / awake and rooting and LC offered to assist with latch for the cross cradle/ after several attempts baby latched with depth and fed 7 mins with multiple swallows and increased with breast compressions. Nipple well rounded when baby released. Baby satisfied and went to sleep STS with mom after the bath.  Latch score 8   Maternal Data Has patient been taught Hand Expression?: Yes  Feeding Mother's Current Feeding Choice: Breast Milk  LATCH Score Latch: Repeated attempts needed to sustain latch, nipple held in mouth throughout feeding, stimulation needed to elicit sucking reflex.  Audible Swallowing: Spontaneous and intermittent  Type of Nipple: Everted at rest and after stimulation  Comfort (Breast/Nipple): Soft / non-tender  Hold (Positioning): Assistance needed to correctly position infant at breast and maintain latch.  LATCH Score: 8   Lactation Tools Discussed/Used    Interventions Interventions: Breast feeding basics reviewed;Assisted with latch;Skin to skin;Breast massage;Breast compression;Adjust position;Support pillows;Education  Discharge Pump: Personal;DEBP  Consult Status Consult Status: Follow-up Date: 05/23/21 Follow-up type: In-patient    Brenda Estrada 05/22/2021, 1:10 PM

## 2021-05-22 NOTE — Anesthesia Postprocedure Evaluation (Signed)
Anesthesia Post Note  Patient: Kierstan Auer  Procedure(s) Performed: AN AD HOC LABOR EPIDURAL     Patient location during evaluation: Mother Baby Anesthesia Type: Epidural Level of consciousness: awake and alert Pain management: pain level controlled Vital Signs Assessment: post-procedure vital signs reviewed and stable Respiratory status: spontaneous breathing, nonlabored ventilation and respiratory function stable Cardiovascular status: stable Postop Assessment: no headache, no backache and epidural receding Anesthetic complications: no   No notable events documented.  Last Vitals:  Vitals:   05/22/21 0221 05/22/21 0328  BP: 107/71 105/66  Pulse: 95 93  Resp: 17 17  Temp: 37.4 C 36.7 C  SpO2: 97% 98%    Last Pain:  Vitals:   05/22/21 0554  TempSrc:   PainSc: 0-No pain   Pain Goal:                   [employer]

## 2021-05-22 NOTE — Lactation Note (Signed)
This note was copied from a baby's chart. Lactation Consultation Note  Patient Name: Brenda Estrada Date: 05/22/2021 Reason for consult: Follow-up assessment;1st time breastfeeding;Mother's request Age:36 hours  P1, Attempted latching in football and cross cradle holds adding pillows for support.  Basics reivewed.  nfant sucked a few times but did not sustain latch and became sleepy.  Mother was able to  hand express drops.  Infant has latched after birth and early this morning for 22 min.  Infant has stooled 7 times. Mother needed to rest. Suggest calling as help is needed. Recommend hand expressing and giving infant drops on spoon with feeds until he has sustained latch.   Maternal Data Has patient been taught Hand Expression?: Yes Does the patient have breastfeeding experience prior to this delivery?: No  Feeding Mother's Current Feeding Choice: Breast Milk  LATCH Score Latch: Repeated attempts needed to sustain latch, nipple held in mouth throughout feeding, stimulation needed to elicit sucking reflex.  Audible Swallowing: None  Type of Nipple: Everted at rest and after stimulation  Comfort (Breast/Nipple): Soft / non-tender  Hold (Positioning): Assistance needed to correctly position infant at breast and maintain latch.  LATCH Score: 6   Lactation Tools Discussed/Used Tools: Pump Breast pump type: Manual  Interventions Interventions: Breast feeding basics reviewed;Assisted with latch;Skin to skin;Hand express;Adjust position;Support pillows;Position options;Education  Discharge    Consult Status Consult Status: Follow-up Date: 05/23/21 Follow-up type: In-patient    Dahlia Byes [facility] 05/22/2021, 10:50 AM

## 2021-05-22 NOTE — Lactation Note (Signed)
This note was copied from a baby's chart. Lactation Consultation Note  Patient Name: Brenda Estrada JOITG'P Date: 05/22/2021 Reason for consult: Initial assessment;1st time breastfeeding Age:36 hours  P1, Reviewed hand expression and hand pump use. Baby sleeping on mother's chest. Feed on demand with cues.  Goal 8-12+ times per day after first 24 hrs.  Place baby STS if not cueing.  Recommend mother call for latch assistance. Mom made aware of O/P services, breastfeeding support groups, community resources, and our phone # for post-discharge questions.    Maternal Data Has patient been taught Hand Expression?: Yes Does the patient have breastfeeding experience prior to this delivery?: No  Feeding Mother's Current Feeding Choice: Breast Milk   Lactation Tools Discussed/Used Tools: Pump Breast pump type: Manual  Interventions Interventions: Breast feeding basics reviewed;Hand pump;Education;LC Services brochure  Consult Status Consult Status: Follow-up Date: 05/22/21 Follow-up type: In-patient    Dahlia Byes [facility] 05/22/2021, 8:40 AM

## 2021-05-23 MED ORDER — IBUPROFEN 600 MG PO TABS: 600.0000 mg | ORAL_TABLET | Freq: Four times a day (QID) | ORAL | 0 refills | Status: DC

## 2021-05-23 MED ORDER — ACETAMINOPHEN 325 MG PO TABS: 650.0000 mg | ORAL_TABLET | ORAL | Status: DC | PRN

## 2021-05-23 NOTE — Lactation Note (Addendum)
This note was copied from a baby's chart. Lactation Consultation Note  Patient Name: Brenda Estrada [id number] Date: 05/23/2021  Mom requested [facility] services confirmed by Secretary Darral Dash).  Age:36 hours, LGA infant greater than 9 lbs, with -8% weight loss. P1, LC entered the room, mom had finished breastfeeding infant for 23 minutes ( [id number]). Mom requested donor breast milk to supplement with breastfeeding infant. LC did not observe latch at this time. Mom decided she would like to supplement infant with donor breast milk, LC gave parents breastfeeding supplemental sheet and infant consumed 14 mls of donor breast milk. Mom was given hand pump, LC reviewed how to use it, mom re-fitted with 21 ml breast flange, per mom this is more comfortable than using the 24 mm breast flange. Mom has abrasion on her left breast only that is healing from previously shallow latches, per mom, she feeling infant is latching well now, only feeling a tug with his latches no pain. LC gave mom comfort gel, for left breast, mom understands not to use it with coconut oil.  Previous LC earlier today did outpatient [facility] referral with mom, discussed discharge information: preventing dehydration in infant, engorgement- treatment and prevention.  Mom's current plan: 1- Mom will continue to breastfeed infant according to feeding cues, 8 to 12+ or more times within 24 hours, skin to skin. 2-Afterwards mom will supplement infant with any of her EBM first that is hand expressed or pumped and then offer infant donor breast milk ,Day 2  of infant's life (7-12 mls ) per feeding or as much as infant wants.  3-Mom knows to call RN/LC if she has any further questions, concerns or needs latch asssitance.  Maternal Data    Feeding    LATCH Score                    Lactation Tools Discussed/Used    Interventions    Discharge    Consult Status      Brenda Estrada 05/23/2021, 7:01 PM

## 2021-05-23 NOTE — Discharge Summary (Addendum)
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  Prenatal Labs: ABO, Rh: --/--/B POS (11/14 1603) / Antibody: NEG (11/14 1603) Rubella: Immune (04/22 0000)  RPR: NON REACTIVE (11/14 1603)  HBsAg: Negative (04/22 0000)  HIV: NON REACTIVE (11/14 1603)  GBS: Negative/-- (10/10 0000)                                    Hospital course:  Induction of Labor With Vaginal Delivery   35 y.o. yo G1P1001 at [redacted]w[redacted]d was admitted to the hospital 05/20/2021 for induction of labor.  Indication for induction: Postdates.  Patient had an uncomplicated labor course as follows: Membrane Rupture Time/Date: 9:51 AM ,05/21/2021   Delivery Method:Vaginal, Spontaneous  Episiotomy: None  Lacerations:  1st degree;Vaginal  Details of delivery can be found in separate delivery note.  Patient had a routine postpartum course. Patient is discharged home 05/24/21.  Newborn Data: Birth date:05/22/2021  Birth time:12:15 AM  Gender:Female  Living status:Living  Apgars:9 ,9  Weight:4091 g  Delivering PROVIDER: Rhea Pink B                                                            Complications: None  Newborn Data: Live born female  Birth Weight: 9 lb 0.3 oz (4091 g) APGAR: 9, 9  Newborn Delivery   Birth date/time: 05/22/2021 00:15:00 Delivery type: Vaginal, Spontaneous      Baby Feeding: Breast Disposition:home with  mother  Post partum procedures: none  Labs: Lab Results  Component Value Date   WBC 22.9 (H) 05/22/2021   HGB 9.5 (L) 05/22/2021   HCT 27.4 (L) 05/22/2021   MCV 96.1 05/22/2021   PLT 264 05/22/2021   CMP Latest Ref Rng & Units 03/04/2021  Glucose 70 - 99 mg/dL 89  BUN 6 - 20 mg/dL <8(E)  Creatinine 4.23 - 1.00 mg/dL 5.36  Sodium 144 - 315 mmol/L 134(L)  Potassium 3.5 - 5.1 mmol/L 3.9  Chloride 98 - 111 mmol/L 104  CO2 22 - 32 mmol/L 26  Calcium 8.9 - 10.3 mg/dL 8.3(L)  Total Protein 6.5 - 8.1 g/dL 6.3(L)  Total Bilirubin 0.3 - 1.2 mg/dL 0.5  Alkaline Phos 38 - 126 U/L 83  AST 15 - 41 U/L 23  ALT 0 - 44 U/L 15    Physical Exam @ time of discharge:  Vitals:   05/22/21 2151 05/23/21 0430 05/23/21 2130 05/24/21 0533  BP: 101/66 100/64 114/75 102/65  Pulse: 82 67 73 60  Resp: 18 18 18 16   Temp: 98.5 F (36.9 C) 98.6 F (37 C) 98.2 F (36.8 C) 97.8 F (36.6 C)  TempSrc:  Oral Oral Oral Axillary  SpO2: 99% 99% 98%   Weight:      Height:       general: alert, cooperative, and no distress lochia: appropriate uterine fundus: firm perineum: well approximated extremities: no edema DVT Evaluation: No evidence of DVT seen on physical exam.  Discharge instructions:  "Baby and Me Booklet"  Discharge Medications:  Allergies as of 05/24/2021       Reactions   Erythromycin Hives   Also vomiting         Medication List     TAKE these medications    acetaminophen 325 MG tablet Commonly known as: Tylenol Take 2 tablets (650 mg total) by mouth every 4 (four) hours as needed (for pain scale < 4).   albuterol 108 (90 Base) MCG/ACT inhaler Commonly known as: VENTOLIN HFA Inhale 2 puffs into the lungs every 4 (four) hours as needed for wheezing or shortness of breath.   cholecalciferol 25 MCG (1000 UNIT) tablet Commonly known as: VITAMIN D3 Take 1,000 Units by mouth daily.   ibuprofen 600 MG tablet Commonly known as: ADVIL Take 1 tablet (600 mg total) by mouth  every 6 (six) hours.   prenatal multivitamin Tabs tablet Take 1 tablet by mouth daily at 12 noon.       Diet: routine diet Activity: Advance as tolerated. Pelvic rest x 6 weeks.  Follow up:6 weeks  Signed: Essie Hart MD 05/24/2021, 10:41 AM

## 2021-05-23 NOTE — Lactation Note (Signed)
This note was copied from a baby's chart. Lactation Consultation Note  Patient Name: Brenda Estrada JOACZ'Y Date: 05/23/2021   Age:36 hours Mother requesting someone to check latch. Observed infant already latched and feeding when LC arrived to the room. Mother denies having any discomfort with latch on or while feeding infant. Infant was observed with strong tugging and suck swallow pattern. Infant released the breast and nipple round. No observed pinching or compression.  Discussed treatment and prevention of engorgement.  Advised mother to follow up with LC OP DEPT for pre and post wt assessment. Mother agreeable.   Maternal Data    Feeding    LATCH Score Latch: Grasps breast easily, tongue down, lips flanged, rhythmical sucking.  Audible Swallowing: A few with stimulation  Type of Nipple: Everted at rest and after stimulation  Comfort (Breast/Nipple): Soft / non-tender  Hold (Positioning): Assistance needed to correctly position infant at breast and maintain latch.  LATCH Score: 8   Lactation Tools Discussed/Used    Interventions Interventions: Breast feeding basics reviewed  Discharge    Consult Status      Michel Bickers 05/23/2021, 1:33 PM

## 2021-05-24 NOTE — Progress Notes (Signed)
Post Partum Day 2 Subjective: no complaints, up ad lib, voiding, and tolerating PO  Objective: Blood pressure 102/65, pulse 60, temperature 97.8 F (36.6 C), temperature source Axillary, resp. rate 16, height 5\' 10"  (1.778 m), weight 98.7 kg, last menstrual period 08/06/2020, SpO2 98 %, unknown if currently breastfeeding.  Physical Exam:  General: alert, cooperative, and no distress Lochia: appropriate Uterine Fundus: firm Incision: healing well, no significant drainage, no dehiscence, no significant erythema DVT Evaluation: No evidence of DVT seen on physical exam. Negative Homan's sign. No cords or calf tenderness.  Recent Labs    05/22/21 0525  HGB 9.5*  HCT 27.4*    Assessment/Plan: Discharge home   LOS: 4 days   [facility] 05/24/2021, 10:39 AM

## 2021-05-24 NOTE — Lactation Note (Signed)
This note was copied from a baby's chart. Lactation Consultation Note  Patient Name: Brenda Estrada [id number] Date: 05/24/2021 Reason for consult: Follow-up assessment;Difficult latch Age:36 hours  P1, Mother has baby latched upon entering with frequent swallows. Mother's nipple pain has reduced.  She has coconut oil and comfort gels. Suggest mother post pump to increase her milk supply 4-5 times per day.  Feed on demand with cues.  Goal 8-12+ times per day after first 24 hrs.  Place baby STS if not cueing.  Reviewed engorgement care and monitoring voids/stools.    Feeding Mother's Current Feeding Choice: Breast Milk and Formula Nipple Type: Slow - flow  LATCH Score Latch: Grasps breast easily, tongue down, lips flanged, rhythmical sucking. (latched upon entering)  Audible Swallowing: Spontaneous and intermittent  Type of Nipple: Everted at rest and after stimulation  Comfort (Breast/Nipple): Filling, red/small blisters or bruises, mild/mod discomfort  Hold (Positioning): No assistance needed to correctly position infant at breast.  LATCH Score: 9   Lactation Tools Discussed/Used  DEBP  Interventions Interventions: Breast feeding basics reviewed;Education  Discharge Discharge Education: Engorgement and breast care;Warning signs for feeding baby Pump: DEBP;Personal  Consult Status Consult Status: Complete Date: 05/24/21    Dahlia Byes [facility] 05/24/2021, 10:50 AM

## 2021-06-04 ENCOUNTER — Telehealth (HOSPITAL_COMMUNITY): Payer: Self-pay | Admitting: *Deleted

## 2021-06-04 NOTE — Telephone Encounter (Signed)
Phone voicemail message left to return nurse call.  Duffy Rhody, RN 06-04-2021 at 1:44pm

## 2021-10-16 NOTE — Patient Instructions (Signed)
Thank you for choosing [place] at [facility] for your Primary Care needs. I am excited for the opportunity to partner with you to meet your health care goals. It was a pleasure meeting you today! ? ?Recommendations from today's visit: ?We will get the labs and make sure there is nothing that could be contributing to the difficulty losing weight  ?If labs are OK we can look at medication options- I have included information on phentermine and the [facility] for you to look over and may help make a decision on which route you would like to try if needed.  ? ?Information on diet, exercise, and health maintenance recommendations are listed below. This is information to help you be sure you are on track for optimal health and monitoring.  ? ?Please look over this and let us know if you have any questions or if you have completed any of the health maintenance outside of [place] so that we can be sure your records are up to date.  ?___________________________________________________________ ?About Me: ?I am an Adult-Geriatric Nurse Practitioner with a background in caring for patients for more than 20 years with a strong intensive care background. I provide primary care and sports medicine services to patients age 49 and older within this office. My education had a strong focus on caring for the older adult population, which I am passionate about. I am also the director of the APP Fellowship with [facility].  ? ?My desire is to provide you with the best service through preventive medicine and supportive care. I consider you a part of the medical team and value your input. I work diligently to ensure that you are heard and your needs are met in a safe and effective manner. I want you to feel comfortable with me as your provider and want you to know that your health concerns are important to me. ? ?For your information, our office hours are: ?Monday, Tuesday, and Thursday 8:00 AM - 5:00  PM ?Wednesday and Friday 8:00 AM - 12:00 PM.  ? ?In my time away from the office I am teaching new APP's within the system and am unavailable, but my partner, Dr. Burnard Bunting is in the office for emergent needs.  ? ?If you have questions or concerns, please call our office at [phone] or send [place] a MyChart message and we will respond as quickly as possible.  ?____________________________________________________________ ?MyChart:  ?For all urgent or time sensitive needs we ask that you please call the office to avoid delays. Our number is [phone]. ?MyChart is not constantly monitored and due to the large volume of messages a day, replies may take up to 72 business hours. ? ?MyChart Policy: ?MyChart allows for you to see your visit notes, after visit summary, provider recommendations, lab and tests results, make an appointment, request refills, and contact your provider or the office for non-urgent questions or concerns. Providers are seeing patients during normal business hours and do not have built in time to review MyChart messages.  ?We ask that you allow a minimum of 3 business days for responses to [employer]. For this reason, please do not send urgent requests through [place]. Please call the office at [phone]. ?New and ongoing conditions may require a visit. We have virtual and in person visit available for your convenience.  ?Complex MyChart concerns may require a visit. Your provider may request you schedule a virtual or in person visit to ensure we are providing the best care possible. ?  MyChart messages sent after 11:00 AM on Friday will not be received by the provider until Monday morning.  ?  ?Lab and Test Results: ?You will receive your lab and test results on MyChart as soon as they are completed and results have been sent by the lab or testing facility. Due to this service, you will receive your results BEFORE your provider.  ?I review lab and tests results each morning prior to seeing  patients. Some results require collaboration with other providers to ensure you are receiving the most appropriate care. For this reason, we ask that you please allow a minimum of 3-5 business days from the time the ALL results have been received for your provider to receive and review lab and test results and contact you about these.  ?Most lab and test result comments from the provider will be sent through [place]. Your provider may recommend changes to the plan of care, follow-up visits, repeat testing, ask questions, or request an office visit to discuss these results. You may reply directly to this message or call the office at [phone] to provide information for the provider or set up an appointment. ?In some instances, you will be called with test results and recommendations. Please let us know if this is preferred and we will make note of this in your chart to provide this for you.    ?If you have not heard a response to your lab or test results in 5 business days from all results returning to [place], please call the office to let us know. We ask that you please avoid calling prior to this time unless there is an emergent concern. Due to high call volumes, this can delay the resulting process. ? ?After Hours: ?For all non-emergency after hours needs, please call the office at [phone] and select the option to reach the on-call provider service. On-call services are shared between multiple [place] offices and therefore it will not be possible to speak directly with your provider. On-call providers may provide medical advice and recommendations, but are unable to provide refills for maintenance medications.  ?For all emergency or urgent medical needs after normal business hours, we recommend that you seek care at the closest Urgent Care or Emergency Department to ensure appropriate treatment in a timely manner.  ?[facility] at [place] has a 24 hour emergency room located on the ground  floor for your convenience.  ? ?Urgent Concerns During the Business Day ?Providers are seeing patients from 8AM to [facility] with a busy schedule and are most often not able to respond to non-urgent calls until the end of the day or the next business day. ?If you should have URGENT concerns during the day, please call and speak to the nurse or schedule a same day appointment so that we can address your concern without delay.  ? ?Thank you, again, for choosing me as your health care partner. I appreciate your trust and look forward to learning more about you.  ? ?Worthy Keeler, DNP, AGNP-c ?___________________________________________________________ ? ?Health Maintenance Recommendations ?Screening Testing ?Mammogram ?Every 1 -2 years based on history and risk factors ?Starting at age 74 ?Pap Smear ?Ages 21-39 every 3 years ?Ages 15-65 every 5 years with HPV testing ?More frequent testing may be required based on results and history ?Colon Cancer Screening ?Every 1-10 years based on test performed, risk factors, and history ?Starting at age 34 ?Bone Density Screening ?Every 2-10 years based on history ?Starting at age 70 for women ?Recommendations for  men differ based on medication usage, history, and risk factors ?AAA Screening ?One time ultrasound ?Men 74-26 years old who have every smoked ?Lung Cancer Screening ?Low Dose Lung CT every 12 months ?Age 63-80 years with a 30 pack-year smoking history who still smoke or who have quit within the last 15 years ? ?Screening Labs ?Routine  Labs: Complete Blood Count (CBC), Complete Metabolic Panel (CMP), Cholesterol (Lipid Panel) ?Every 6-12 months based on history and medications ?May be recommended more frequently based on current conditions or previous results ?Hemoglobin A1c Lab ?Every 3-12 months based on history and previous results ?Starting at age 49 or earlier with diagnosis of diabetes, high cholesterol, BMI >26, and/or risk factors ?Frequent monitoring for patients  with diabetes to ensure blood sugar control ?Thyroid Panel (TSH w/ T3 & T4) ?Every 6 months based on history, symptoms, and risk factors ?May be repeated more often if on medication ?HIV ?One time testing for all

## 2021-10-16 NOTE — Progress Notes (Signed)
?Orma Render, DNP, AGNP-c ?Primary Care & Sports Medicine ?[place], [place] [address] ?[phone] ? ?New patient visit ? ? ?Patient: Brenda Estrada   DOB: [birth year]   36 y.o. Female  MRN: [id number] ?Visit Date: 10/17/2021 ? ?Patient Care Team: ?Teyton Pattillo, Coralee Pesa, NP as PCP - General (Nurse Practitioner) ? ?Today's healthcare provider: Orma Render, NP  ? ?Chief Complaint  ?Patient presents with  ? Establish Care  ? ?Subjective  ?  ?Brenda Estrada is a 36 y.o. female who presents today as a new patient to establish care.  ?  ?Patient endorses the following concerns presently: ? ?Weight ?Counting calories and strictly monitoring intake ?Working out at [employer] at least 3 days a week ?Walking 5 miles + daily ?No HTN or DM during pregnancy (delivery 5 months ago) ?Had [place] in August and treated with steroids - repeat steroids in October. ?Weight gain during pregnancy gained 62 lbs- all at the end ?She initially lost 20 lbs after delivery and has gained this back and has not lost any additional weight ?Stopped breast feeding last month  ?She has never had an issue with losing weight before  ?She would like to lose 50lbs.  ?Endorses frustration over failed efforts ? ?History reviewed and reveals the following: ?Past Medical History:  ?Diagnosis Date  ? Allergy   ? Asthma   ? ?History reviewed. No pertinent surgical history. ?Family Status  ?Relation Name Status  ? Mother Brenda Estrada (Not Specified)  ? MGF Brenda Estrada (Not Specified)  ? ?Family History  ?Problem Relation Age of Onset  ? ADD / ADHD Mother   ? Arthritis Mother   ? Asthma Mother   ? Cancer Maternal Grandfather   ? ?Social History  ? ?Socioeconomic History  ? Marital status: Significant Other  ?  Spouse name: Not on file  ? Number of children: Not on file  ? Years of education: Not on file  ? Highest education level: Not on file  ?Occupational History  ? Not on file  ?Tobacco Use  ? Smoking status: Never  ?  Smokeless tobacco: Never  ?Vaping Use  ? Vaping Use: Never used  ?Substance and Sexual Activity  ? Alcohol use: Not Currently  ? Drug use: Never  ? Sexual activity: Yes  ?  Birth control/protection: None  ?Other Topics Concern  ? Not on file  ?Social History Narrative  ? Not on file  ? ?Social Determinants of Health  ? ?Financial Resource Strain: Not on file  ?Food Insecurity: Not on file  ?Transportation Needs: Not on file  ?Physical Activity: Not on file  ?Stress: Not on file  ?Social Connections: Not on file  ? ?Outpatient Medications Prior to Visit  ?Medication Sig  ? acetaminophen (TYLENOL) 325 MG tablet Take 2 tablets (650 mg total) by mouth every 4 (four) hours as needed (for pain scale < 4).  ? albuterol (VENTOLIN HFA) 108 (90 Base) MCG/ACT inhaler Inhale 2 puffs into the lungs every 4 (four) hours as needed for wheezing or shortness of breath.  ? cholecalciferol (VITAMIN D3) 25 MCG (1000 UNIT) tablet Take 1,000 Units by mouth daily.  ? ibuprofen (ADVIL) 600 MG tablet Take 1 tablet (600 mg total) by mouth every 6 (six) hours.  ? Prenatal Vit-Fe Fumarate-FA (PRENATAL MULTIVITAMIN) TABS tablet Take 1 tablet by mouth daily at 12 noon.  ? ?No facility-administered medications prior to visit.  ? ?Allergies  ?Allergen Reactions  ? Erythromycin  Hives and Other (See Comments)  ?  Also vomiting   ? ?There is no immunization history for the selected administration types on file for this patient. ? ?Review of Systems ?All review of systems negative except what is listed in the HPI ? ? Objective  ?  ?BP 117/72   Pulse 86   Temp 98.4 ?F (36.9 ?C)   Ht 5' 10"  (1.778 m)   Wt 211 lb 12.8 oz (96.1 kg)   SpO2 97%   BMI 30.39 kg/m?  ?Physical Exam ?Vitals and nursing note reviewed.  ?Constitutional:   ?   General: She is not in acute distress. ?   Appearance: Normal appearance.  ?Eyes:  ?   Extraocular Movements: Extraocular movements intact.  ?   Conjunctiva/sclera: Conjunctivae normal.  ?   Pupils: Pupils are equal,  round, and reactive to light.  ?Neck:  ?   Vascular: No carotid bruit.  ?Cardiovascular:  ?   Rate and Rhythm: Normal rate and regular rhythm.  ?   Pulses: Normal pulses.  ?   Heart sounds: Normal heart sounds. No murmur heard. ?Pulmonary:  ?   Effort: Pulmonary effort is normal.  ?   Breath sounds: Normal breath sounds. No wheezing.  ?Abdominal:  ?   General: Bowel sounds are normal.  ?   Palpations: Abdomen is soft.  ?Musculoskeletal:     ?   General: Normal range of motion.  ?   Cervical back: Normal range of motion.  ?   Right lower leg: No edema.  ?   Left lower leg: No edema.  ?Skin: ?   General: Skin is warm and dry.  ?   Capillary Refill: Capillary refill takes less than 2 seconds.  ?Neurological:  ?   General: No focal deficit present.  ?   Mental Status: She is alert and oriented to person, place, and time.  ?Psychiatric:     ?   Mood and Affect: Mood normal.     ?   Behavior: Behavior normal.     ?   Thought Content: Thought content normal.     ?   Judgment: Judgment normal.  ? ? ?Results for orders placed or performed in visit on 10/17/21  ?Hemoglobin A1c  ?Result Value Ref Range  ? Hgb A1c MFr Bld 5.4 4.8 - 5.6 %  ? Est. average glucose Bld gHb Est-mCnc 108 mg/dL  ?VITAMIN D 25 Hydroxy (Vit-D Deficiency, Fractures)  ?Result Value Ref Range  ? Vit D, 25-Hydroxy 37.0 30.0 - 100.0 ng/mL  ?TSH  ?Result Value Ref Range  ? TSH 0.868 0.450 - 4.500 uIU/mL  ?Lipid panel  ?Result Value Ref Range  ? Cholesterol, Total 160 100 - 199 mg/dL  ? Triglycerides 57 0 - 149 mg/dL  ? HDL 54 >39 mg/dL  ? VLDL Cholesterol Cal 12 5 - 40 mg/dL  ? LDL Chol Calc (NIH) 94 0 - 99 mg/dL  ? Chol/HDL Ratio 3.0 0.0 - 4.4 ratio  ?Comprehensive metabolic panel  ?Result Value Ref Range  ? Glucose 95 70 - 99 mg/dL  ? BUN 11 6 - 20 mg/dL  ? Creatinine, Ser 0.69 0.57 - 1.00 mg/dL  ? eGFR 115 >59 mL/min/1.73  ? BUN/Creatinine Ratio 16 9 - 23  ? Sodium 140 134 - 144 mmol/L  ? Potassium 4.4 3.5 - 5.2 mmol/L  ? Chloride 104 96 - 106 mmol/L  ?  CO2 23 20 - 29 mmol/L  ? Calcium 9.5 8.7 - 10.2 mg/dL  ?  Total Protein 6.9 6.0 - 8.5 g/dL  ? Albumin 4.6 3.8 - 4.8 g/dL  ? Globulin, Total 2.3 1.5 - 4.5 g/dL  ? Albumin/Globulin Ratio 2.0 1.2 - 2.2  ? Bilirubin Total 0.4 0.0 - 1.2 mg/dL  ? Alkaline Phosphatase 82 44 - 121 IU/L  ? AST 19 0 - 40 IU/L  ? ALT 10 0 - 32 IU/L  ?CBC with Differential/Platelet  ?Result Value Ref Range  ? WBC 7.4 3.4 - 10.8 x10E3/uL  ? RBC 4.32 3.77 - 5.28 x10E6/uL  ? Hemoglobin 13.4 11.1 - 15.9 g/dL  ? Hematocrit 39.5 34.0 - 46.6 %  ? MCV 91 79 - 97 fL  ? MCH 31.0 26.6 - 33.0 pg  ? MCHC 33.9 31.5 - 35.7 g/dL  ? RDW 11.9 11.7 - 15.4 %  ? Platelets 412 150 - 450 x10E3/uL  ? Neutrophils 53 Not Estab. %  ? Lymphs 35 Not Estab. %  ? Monocytes 8 Not Estab. %  ? Eos 3 Not Estab. %  ? Basos 1 Not Estab. %  ? Neutrophils Absolute 4.0 1.4 - 7.0 x10E3/uL  ? Lymphocytes Absolute 2.5 0.7 - 3.1 x10E3/uL  ? Monocytes Absolute 0.6 0.1 - 0.9 x10E3/uL  ? EOS (ABSOLUTE) 0.2 0.0 - 0.4 x10E3/uL  ? Basophils Absolute 0.1 0.0 - 0.2 x10E3/uL  ? Immature Granulocytes 0 Not Estab. %  ? Immature Grans (Abs) 0.0 0.0 - 0.1 x10E3/uL  ?T4, free  ?Result Value Ref Range  ? Free T4 1.09 0.82 - 1.77 ng/dL  ? ? Assessment & Plan   ?  ? ?Problem List Items Addressed This Visit   ? ? BMI 30.0-30.9,adult  ?  Difficulty losing weight following delivery of baby 5 months ago. Very healthy lifestyle with strict dietary monitoring and exercise habits. At this time unclear if fat loss is present and muscle mass is building, which could be hiding weight reduction vs pathological issue complicating weight loss efforts. Will obtain labs today for further evaluation and make changes to the plan of care based on findings.  ?Consider medication such as GLP if patient is eligible based on results.  ?Will plan to f/u with patient about results and discuss further in near future.  ? ?  ?  ? Relevant Orders  ? Hemoglobin A1c (Completed)  ? VITAMIN D 25 Hydroxy (Vit-D Deficiency, Fractures)  (Completed)  ? TSH (Completed)  ? Lipid panel (Completed)  ? Comprehensive metabolic panel (Completed)  ? CBC with Differential/Platelet (Completed)  ? T4, free (Completed)  ? ?Other Visit Diagnoses   ? ? Encounter for me

## 2021-10-17 ENCOUNTER — Encounter (HOSPITAL_BASED_OUTPATIENT_CLINIC_OR_DEPARTMENT_OTHER): Payer: Self-pay | Admitting: Nurse Practitioner

## 2021-10-17 ENCOUNTER — Ambulatory Visit (INDEPENDENT_AMBULATORY_CARE_PROVIDER_SITE_OTHER): Payer: Medicaid Other | Admitting: Nurse Practitioner

## 2021-10-17 VITALS — BP 117/72 | HR 86 | Temp 98.4°F | Ht 70.0 in | Wt 211.8 lb

## 2021-10-17 DIAGNOSIS — Z6830 Body mass index (BMI) 30.0-30.9, adult: Secondary | ICD-10-CM | POA: Diagnosis not present

## 2021-10-17 DIAGNOSIS — Z1321 Encounter for screening for nutritional disorder: Secondary | ICD-10-CM

## 2021-10-17 DIAGNOSIS — Z130 Encounter for screening for diseases of the blood and blood-forming organs and certain disorders involving the immune mechanism: Secondary | ICD-10-CM

## 2021-10-17 DIAGNOSIS — Z13228 Encounter for screening for other metabolic disorders: Secondary | ICD-10-CM

## 2021-10-17 DIAGNOSIS — Z0000 Encounter for general adult medical examination without abnormal findings: Secondary | ICD-10-CM | POA: Diagnosis not present

## 2021-10-17 DIAGNOSIS — Z1329 Encounter for screening for other suspected endocrine disorder: Secondary | ICD-10-CM

## 2021-10-18 LAB — CBC WITH DIFFERENTIAL/PLATELET
Basophils Absolute: 0.1 10*3/uL (ref 0.0–0.2)
Basos: 1 %
EOS (ABSOLUTE): 0.2 10*3/uL (ref 0.0–0.4)
Eos: 3 %
Hematocrit: 39.5 % (ref 34.0–46.6)
Hemoglobin: 13.4 g/dL (ref 11.1–15.9)
Immature Grans (Abs): 0 10*3/uL (ref 0.0–0.1)
Immature Granulocytes: 0 %
Lymphocytes Absolute: 2.5 10*3/uL (ref 0.7–3.1)
Lymphs: 35 %
MCH: 31 pg (ref 26.6–33.0)
MCHC: 33.9 g/dL (ref 31.5–35.7)
MCV: 91 fL (ref 79–97)
Monocytes Absolute: 0.6 10*3/uL (ref 0.1–0.9)
Monocytes: 8 %
Neutrophils Absolute: 4 10*3/uL (ref 1.4–7.0)
Neutrophils: 53 %
Platelets: 412 10*3/uL (ref 150–450)
RBC: 4.32 x10E6/uL (ref 3.77–5.28)
RDW: 11.9 % (ref 11.7–15.4)
WBC: 7.4 10*3/uL (ref 3.4–10.8)

## 2021-10-18 LAB — COMPREHENSIVE METABOLIC PANEL
ALT: 10 IU/L (ref 0–32)
AST: 19 IU/L (ref 0–40)
Albumin/Globulin Ratio: 2 (ref 1.2–2.2)
Albumin: 4.6 g/dL (ref 3.8–4.8)
Alkaline Phosphatase: 82 IU/L (ref 44–121)
BUN/Creatinine Ratio: 16 (ref 9–23)
BUN: 11 mg/dL (ref 6–20)
Bilirubin Total: 0.4 mg/dL (ref 0.0–1.2)
CO2: 23 mmol/L (ref 20–29)
Calcium: 9.5 mg/dL (ref 8.7–10.2)
Chloride: 104 mmol/L (ref 96–106)
Creatinine, Ser: 0.69 mg/dL (ref 0.57–1.00)
Globulin, Total: 2.3 g/dL (ref 1.5–4.5)
Glucose: 95 mg/dL (ref 70–99)
Potassium: 4.4 mmol/L (ref 3.5–5.2)
Sodium: 140 mmol/L (ref 134–144)
Total Protein: 6.9 g/dL (ref 6.0–8.5)
eGFR: 115 mL/min/{1.73_m2} (ref >59)

## 2021-10-18 LAB — LIPID PANEL
Chol/HDL Ratio: 3 ratio (ref 0.0–4.4)
Cholesterol, Total: 160 mg/dL (ref 100–199)
HDL: 54 mg/dL (ref >39)
LDL Chol Calc (NIH): 94 mg/dL (ref 0–99)
Triglycerides: 57 mg/dL (ref 0–149)
VLDL Cholesterol Cal: 12 mg/dL (ref 5–40)

## 2021-10-18 LAB — T4, FREE: Free T4: 1.09 ng/dL (ref 0.82–1.77)

## 2021-10-18 LAB — HEMOGLOBIN A1C
Est. average glucose Bld gHb Est-mCnc: 108 mg/dL
Hgb A1c MFr Bld: 5.4 % (ref 4.8–5.6)

## 2021-10-18 LAB — TSH: TSH: 0.868 u[IU]/mL (ref 0.450–4.500)

## 2021-10-18 LAB — VITAMIN D 25 HYDROXY (VIT D DEFICIENCY, FRACTURES): Vit D, 25-Hydroxy: 37 ng/mL (ref 30.0–100.0)

## 2021-10-22 DIAGNOSIS — Z6830 Body mass index (BMI) 30.0-30.9, adult: Secondary | ICD-10-CM | POA: Insufficient documentation

## 2021-10-22 NOTE — Assessment & Plan Note (Signed)
Difficulty losing weight following delivery of baby 5 months ago. Very healthy lifestyle with strict dietary monitoring and exercise habits. At this time unclear if fat loss is present and muscle mass is building, which could be hiding weight reduction vs pathological issue complicating weight loss efforts. Will obtain labs today for further evaluation and make changes to the plan of care based on findings.  ?Consider medication such as GLP if patient is eligible based on results.  ?Will plan to f/u with patient about results and discuss further in near future.  ?

## 2021-10-26 ENCOUNTER — Encounter (HOSPITAL_BASED_OUTPATIENT_CLINIC_OR_DEPARTMENT_OTHER): Payer: Self-pay | Admitting: Nurse Practitioner

## 2021-11-05 ENCOUNTER — Ambulatory Visit (INDEPENDENT_AMBULATORY_CARE_PROVIDER_SITE_OTHER): Payer: Medicaid Other | Admitting: Nurse Practitioner

## 2021-11-05 ENCOUNTER — Encounter (HOSPITAL_BASED_OUTPATIENT_CLINIC_OR_DEPARTMENT_OTHER): Payer: Self-pay | Admitting: Nurse Practitioner

## 2021-11-05 ENCOUNTER — Telehealth (HOSPITAL_BASED_OUTPATIENT_CLINIC_OR_DEPARTMENT_OTHER): Payer: Self-pay | Admitting: Nurse Practitioner

## 2021-11-05 DIAGNOSIS — R110 Nausea: Secondary | ICD-10-CM | POA: Diagnosis not present

## 2021-11-05 DIAGNOSIS — Z6830 Body mass index (BMI) 30.0-30.9, adult: Secondary | ICD-10-CM | POA: Diagnosis not present

## 2021-11-05 MED ORDER — SEMAGLUTIDE-WEIGHT MANAGEMENT 0.25 MG/0.5ML ~~LOC~~ SOAJ: 0.2500 mg | SUBCUTANEOUS | 0 refills | Status: AC

## 2021-11-05 MED ORDER — SEMAGLUTIDE-WEIGHT MANAGEMENT 0.5 MG/0.5ML ~~LOC~~ SOAJ: 0.5000 mg | SUBCUTANEOUS | 4 refills | Status: DC

## 2021-11-05 MED ORDER — ONDANSETRON HCL 8 MG PO TABS: 8.0000 mg | ORAL_TABLET | Freq: Three times a day (TID) | ORAL | 2 refills | Status: DC | PRN

## 2021-11-05 NOTE — Progress Notes (Signed)
Virtual Visit Encounter telephone visit. ? ? ?I connected with  Brenda Estrada on 11/10/21 at  8:50 AM EDT by secure audio telemedicine application. I verified that I am speaking with the correct person using two identifiers. ?  ?I introduced myself as a Publishing rights manager with the practice. The limitations of evaluation and management by telemedicine discussed with the patient and the availability of in person appointments. The patient expressed verbal understanding and consent to proceed. ? ?Participating parties in this visit include: Myself and patient ? ?The patient is: Patient Location: Home ?I am: Provider Location: Office/Clinic ?Subjective:   ? ?CC and HPI: Brenda Estrada is a 36 y.o. year old female presenting for new evaluation and treatment of weight. ?Kamerin endorses concerns today about her current weight.  ?She has recently had a baby and has had a very difficult time losing the weight from pregnancy.  ?She cooks all of her meals at home and does not eat fatty or fried foods.  ?She is exercising every day and working very hard to lose this weight without medication.  ?Despite her diet and exercise she has not managed to successfully lose any of her postpartum weight and would like to know what options are available for her to try to increase her metabolism at this time ?She reports that she has never had difficulty losing weight therefore this is all a very new finding for her ?She is not on any medications that could be contributing to weight gain. ?She is not currently breast-feeding ? ? ?Past medical history, Surgical history, Family history not pertinant except as noted below, Social history, Allergies, and medications have been entered into the medical record, reviewed, and corrections made.  ? ?Review of Systems:  ?All review of systems negative except what is listed in the HPI ? ?Objective:   ? ?Alert and oriented x 4 ?Speaking in clear sentences with no shortness of breath. ?No  distress. ? ?Impression and Recommendations:   ? ?Problem List Items Addressed This Visit   ? ? BMI 30.0-30.9,adult - Primary  ?  Very healthy lifestyle with daily exercise and physical activity and dietary monitoring with an success full weight loss.  Recent labs reviewed with patient today which show no abnormal findings. ?At this time her BMI is greater than 30 and I do feel that she would benefit from medication to help improve her weight loss efforts. ?Discussion about medication options with patient today.  She would like to try semaglutide for weight management.  We will send order in today and see if we can get this medication approved for her due to her BMI greater than 30.  No risk factors identified with taking medication today.  We will plan to follow-up in 3 months after starting medication or sooner if needed. ? ?  ?  ? Relevant Medications  ? Semaglutide-Weight Management 0.25 MG/0.5ML SOAJ  ? Semaglutide-Weight Management 0.5 MG/0.5ML SOAJ (Start on 12/04/2021)  ? ondansetron (ZOFRAN) 8 MG tablet  ? ?Other Visit Diagnoses   ? ? Nausea      ? Relevant Medications  ? ondansetron (ZOFRAN) 8 MG tablet  ? ?  ? ? ?orders and follow up as documented in EMR ?I discussed the assessment and treatment plan with the patient. The patient was provided an opportunity to ask questions and all were answered. The patient agreed with the plan and demonstrated an understanding of the instructions. ?  ?The patient was advised to call back or seek an in-person evaluation if  the symptoms worsen or if the condition fails to improve as anticipated. ? ?Follow-Up: in 3 months ? ?I provided 22 minutes of non-face-to-face interaction with this non face-to-face encounter including intake, same-day documentation, and chart review.  ? ?Tollie Eth, NP , DNP, AGNP-c ?[facility] ?Primary Care & Sports Medicine at [facility] ?[phone] ?[phone] (fax) ? ?

## 2021-11-05 NOTE — Patient Instructions (Signed)
I have sent the [facility] to [facility] at [address]. If this needs a prior authorization they will let us know and we will get started on that. I have also sent in Zofran for you in case you have nausea.  ? ?I will touch base with you in about 3 months with a phone call just to make sure that everything is going well and you don't have any concerns, but do not hesitate to reach out to me if you have any concerns or questions before then.  ? ? ?

## 2021-11-05 NOTE — Telephone Encounter (Signed)
Receoved fax transmission from pt's ins company denying coverage for [facility] 0.25MG / dose pen. Documents will be in provider's yellow dot tray. ?

## 2021-11-06 ENCOUNTER — Telehealth (HOSPITAL_BASED_OUTPATIENT_CLINIC_OR_DEPARTMENT_OTHER): Payer: Self-pay

## 2021-11-06 NOTE — Telephone Encounter (Signed)
I received fax from [facility] that patient was denial for Ozempic. I placed the denial in the scan bin.  ?

## 2021-11-10 NOTE — Assessment & Plan Note (Signed)
Very healthy lifestyle with daily exercise and physical activity and dietary monitoring with an success full weight loss.  Recent labs reviewed with patient today which show no abnormal findings. ?At this time her BMI is greater than 30 and I do feel that she would benefit from medication to help improve her weight loss efforts. ?Discussion about medication options with patient today.  She would like to try semaglutide for weight management.  We will send order in today and see if we can get this medication approved for her due to her BMI greater than 30.  No risk factors identified with taking medication today.  We will plan to follow-up in 3 months after starting medication or sooner if needed. ?

## 2021-11-11 NOTE — Telephone Encounter (Signed)
Provider's CMA aware of denial. ?

## 2021-12-17 ENCOUNTER — Encounter (HOSPITAL_BASED_OUTPATIENT_CLINIC_OR_DEPARTMENT_OTHER): Payer: Self-pay | Admitting: Nurse Practitioner

## 2021-12-17 ENCOUNTER — Ambulatory Visit (INDEPENDENT_AMBULATORY_CARE_PROVIDER_SITE_OTHER): Payer: Medicaid Other | Admitting: Nurse Practitioner

## 2021-12-17 VITALS — BP 107/74 | HR 62 | Ht 70.0 in | Wt 202.0 lb

## 2021-12-17 DIAGNOSIS — M5441 Lumbago with sciatica, right side: Secondary | ICD-10-CM

## 2021-12-17 DIAGNOSIS — Z6830 Body mass index (BMI) 30.0-30.9, adult: Secondary | ICD-10-CM

## 2021-12-17 MED ORDER — PHENTERMINE HCL 37.5 MG PO CAPS: ORAL_CAPSULE | ORAL | 0 refills | Status: DC

## 2021-12-17 MED ORDER — KETOROLAC TROMETHAMINE 60 MG/2ML IM SOLN: 60.0000 mg | Freq: Once | INTRAMUSCULAR | Status: AC

## 2021-12-17 MED ORDER — DEXAMETHASONE SODIUM PHOSPHATE 10 MG/ML IJ SOLN
10.0000 mg | Freq: Once | INTRAMUSCULAR | Status: AC
  Administered 2021-12-17: 10 mg via INTRAMUSCULAR

## 2021-12-17 MED ORDER — CYCLOBENZAPRINE HCL 10 MG PO TABS: 10.0000 mg | ORAL_TABLET | Freq: Three times a day (TID) | ORAL | 0 refills | Status: DC | PRN

## 2021-12-17 MED ORDER — PREDNISONE 20 MG PO TABS: 40.0000 mg | ORAL_TABLET | Freq: Every day | ORAL | 0 refills | Status: DC

## 2021-12-17 NOTE — Patient Instructions (Signed)
If no better in the next week, please let me know and we can always place the order for the x-ray.

## 2021-12-17 NOTE — Progress Notes (Signed)
Tollie Eth, DNP, AGNP-c [facility] [address]  [address] [place], [place] [address] [phone]  Subjective:   Brenda Estrada is a 36 y.o. female presents to day for back pain Back pain Ovie endorses onset of back pain on Friday.  She tells me that she has been working out quite a bit and feels that this may be related although she denies any particular specific injury. She describes the pain on the right lumbar region radiating down through the buttocks and into the hip and thigh. Pain is present with standing, walking, lifting.  She tells me that lifting her leg just to put on her pants because significant amount of pain-specifically the right leg.  She tells me while she is seated the pain is present but it is minimal compared to other times.   She endorses that leaning to the left while seated does seem to help improve the pain somewhat. She has been utilizing ibuprofen and an old prescription of Flexeril that she had.  She has also been using ice.  She tells me that the medication was not necessarily effective but the ice did help somewhat. She denies any incontinence of bowel or bladder, decreased sensation in her feet, falls, rashes, or fevers. Weight management She also endorses concerns with her weight.  She has been working out very consistently at [employer] and monitors her diet very closely and has not been able to lose weight effectively.  She delivered her youngest child last November and feels that she has not been able to lose the majority of her baby weight She is not currently breast-feeding she would like to know if there is something that she could take that would help boost her metabolism to help with weight loss. She has not used medications for this in the past She has had recent lab work done which did not show any abnormality that could indicate reason for difficulty losing weight.  PMH, Medications, and Allergies reviewed  and updated in chart.   ROS negative except for what is listed in HPI. Objective:  BP 107/74   Pulse 62   Ht 5\' 10"  (1.778 m)   Wt 202 lb (91.6 kg)   LMP 12/17/2021 (Exact Date)   SpO2 99%   Breastfeeding No   BMI 28.98 kg/m  Physical Exam Vitals and nursing note reviewed.  Constitutional:      Appearance: She is ill-appearing.  HENT:     Head: Normocephalic.  Cardiovascular:     Rate and Rhythm: Normal rate and regular rhythm.     Pulses: Normal pulses.  Pulmonary:     Effort: Pulmonary effort is normal.  Musculoskeletal:        General: Tenderness present.     Cervical back: Normal range of motion. No tenderness.     Thoracic back: Spasms present. Normal range of motion.     Lumbar back: Swelling and tenderness present. Decreased range of motion. Positive right straight leg raise test and positive left straight leg raise test.     Comments: Tenderness is present to the lumbar spine primarily on the right and down through the buttock and into the right thigh.  Suspect possible piriformis syndrome could be present based on findings.  Skin:    General: Skin is warm and dry.     Capillary Refill: Capillary refill takes less than 2 seconds.  Neurological:     General: No focal deficit present.  Mental Status: She is alert and oriented to person, place, and time.     Sensory: No sensory deficit.     Motor: Weakness present.     Gait: Gait abnormal.  Psychiatric:        Mood and Affect: Mood normal.        Behavior: Behavior normal.        Thought Content: Thought content normal.        Judgment: Judgment normal.           Assessment & Plan:   Problem List Items Addressed This Visit     BMI 30.0-30.9,adult    Elevated BMI with difficulty losing weight despite strict diet and exercise measures.  Discussed medicinal options for treatment with patient in office today.  I do feel that she would benefit from a GLP-1 however historically Medicaid has denied coverage for  this.  At this time joint decision made to begin with phentermine and monitor closely for effectiveness.  If patient finds this medication to be effective and does not cause any side effects or symptoms we can continue for up to 6 months.  Patient is aware to contact the office immediately and stop the medication if she begins to have unwanted side effects.  We will plan to follow-up in 3 months or sooner if needed.      Acute right-sided low back pain with right-sided sciatica - Primary    Right sided lumbar pain with radiation into the buttock and down into the lateral right thigh.  Suspect possible piriformis syndrome.  Tenderness and spasms are noted to the lumbar spine into the right buttock.  We will obtain lumbar x-rays for complete evaluation given severity of symptoms.  We will also send treatment with Flexeril for muscle spasms.  Toradol and dexamethasone injection provided in the office today.  We will start steroid burst tomorrow.  If x-rays reveal no findings okay to start gentle stretching exercises.  We will consider formal PT if stretching on her own is not effective.  There are no alarm symptoms present at this time.      Relevant Medications   predniSONE (DELTASONE) 20 MG tablet   cyclobenzaprine (FLEXERIL) 10 MG tablet     Tollie Eth, DNP, AGNP-c 01/01/2022  6:05 PM

## 2021-12-27 ENCOUNTER — Other Ambulatory Visit (HOSPITAL_BASED_OUTPATIENT_CLINIC_OR_DEPARTMENT_OTHER): Payer: Self-pay | Admitting: Nurse Practitioner

## 2021-12-27 DIAGNOSIS — Z6830 Body mass index (BMI) 30.0-30.9, adult: Secondary | ICD-10-CM

## 2021-12-27 MED ORDER — PHENTERMINE HCL 30 MG PO CAPS: ORAL_CAPSULE | ORAL | 2 refills | Status: DC

## 2022-01-01 ENCOUNTER — Encounter (HOSPITAL_BASED_OUTPATIENT_CLINIC_OR_DEPARTMENT_OTHER): Payer: Self-pay | Admitting: Nurse Practitioner

## 2022-01-01 DIAGNOSIS — M5441 Lumbago with sciatica, right side: Secondary | ICD-10-CM | POA: Insufficient documentation

## 2022-01-01 HISTORY — DX: Lumbago with sciatica, right side: M54.41

## 2022-01-01 NOTE — Assessment & Plan Note (Addendum)
Right sided lumbar pain with radiation into the buttock and down into the lateral right thigh.  Suspect possible piriformis syndrome.  Tenderness and spasms are noted to the lumbar spine into the right buttock.  Joint decision to begin treatment with Flexeril for muscle spasms and short steroid burst. Toradol and dexamethasone injection provided in the office today.  We will start steroid burst tomorrow.  There are no alarm symptoms present at this time, therefore I feel it is acceptable to begin home stretching and exercises.  Recommend ice and heat to the lumbar spine.  If symptoms are not improved over the next few days with conservative therapy okay to send order for lumbar x-ray for further evaluation.

## 2022-01-01 NOTE — Assessment & Plan Note (Signed)
Elevated BMI with difficulty losing weight despite strict diet and exercise measures.  Discussed medicinal options for treatment with patient in office today.  I do feel that she would benefit from a GLP-1 however historically Medicaid has denied coverage for this.  At this time joint decision made to begin with phentermine and monitor closely for effectiveness.  If patient finds this medication to be effective and does not cause any side effects or symptoms we can continue for up to 6 months.  Patient is aware to contact the office immediately and stop the medication if she begins to have unwanted side effects.  We will plan to follow-up in 3 months or sooner if needed.

## 2022-01-16 ENCOUNTER — Encounter (HOSPITAL_BASED_OUTPATIENT_CLINIC_OR_DEPARTMENT_OTHER): Payer: Self-pay | Admitting: Nurse Practitioner

## 2022-01-17 ENCOUNTER — Encounter (HOSPITAL_BASED_OUTPATIENT_CLINIC_OR_DEPARTMENT_OTHER): Payer: Self-pay | Admitting: Nurse Practitioner

## 2022-01-21 ENCOUNTER — Ambulatory Visit (INDEPENDENT_AMBULATORY_CARE_PROVIDER_SITE_OTHER): Payer: Medicaid Other | Admitting: Nurse Practitioner

## 2022-01-21 ENCOUNTER — Encounter (HOSPITAL_BASED_OUTPATIENT_CLINIC_OR_DEPARTMENT_OTHER): Payer: Self-pay | Admitting: Nurse Practitioner

## 2022-01-21 VITALS — BP 104/75 | HR 80 | Ht 70.0 in | Wt 200.4 lb

## 2022-01-21 DIAGNOSIS — L508 Other urticaria: Secondary | ICD-10-CM

## 2022-01-21 DIAGNOSIS — Z6830 Body mass index (BMI) 30.0-30.9, adult: Secondary | ICD-10-CM

## 2022-01-21 MED ORDER — HYDROXYZINE PAMOATE 25 MG PO CAPS: 25.0000 mg | ORAL_CAPSULE | Freq: Every evening | ORAL | 0 refills | Status: DC | PRN

## 2022-01-21 MED ORDER — TRIAMCINOLONE ACETONIDE 0.1 % EX CREA: 1.0000 | TOPICAL_CREAM | Freq: Two times a day (BID) | CUTANEOUS | 1 refills | Status: AC

## 2022-01-21 MED ORDER — PREDNISONE 20 MG PO TABS: 40.0000 mg | ORAL_TABLET | Freq: Every day | ORAL | 0 refills | Status: AC

## 2022-01-21 NOTE — Patient Instructions (Signed)
You can use the diaper rash ointment on the areas to help dry them out and keep the moisture from making them itch as bad.   I have sent in cream to use on the areas to help with itching- this should specifically help under the breasts  I also sent in prednisone and hydroxyzine  If the rash goes away then comes right back after stopping the prednisone let me know

## 2022-01-21 NOTE — Progress Notes (Signed)
Shawna Clamp, DNP, AGNP-c [facility] [address] [place], [place] [address] [phone] Office [phone] Fax  ESTABLISHED PATIENT- Chronic Health and/or Follow-Up Visit  Blood pressure 104/75, pulse 80, height 5\' 10"  (1.778 m), weight 200 lb 6.4 oz (90.9 kg), SpO2 100 %, not currently breastfeeding.  Follow-up (Pt here for f/u weight loss medication she also stated she has a rash under her breast it has spread arms and stomach it appeared 2 weeks ago )   HPI  Brenda Estrada  is a 36 y.o. year old female presenting today for evaluation and management of the following: Rash Started on the 4th one or two places under the breasts Has spread under all of the breast tissue and onto the breasts, then onto abdomen, onto back and arms Dont feel bad, no fevers.  No new foo, detergent, exposures No recent illness, tick bites, exposures to chemicals. Asthma has been acting up lately.  No sore throat, swollen glands, n/v/d/fevers Weight Has lost some weight, but not able to work out due to back injury She is anxious to start working out again in the near future and feels that she may be able to manage this with diet and excise  ROS All ROS negative with exception of what is listed in HPI  PHYSICAL EXAM Physical Exam Vitals and nursing note reviewed.  Constitutional:      General: She is not in acute distress.    Appearance: Normal appearance.  HENT:     Head: Normocephalic.  Eyes:     Extraocular Movements: Extraocular movements intact.     Conjunctiva/sclera: Conjunctivae normal.     Pupils: Pupils are equal, round, and reactive to light.  Neck:     Vascular: No carotid bruit.  Cardiovascular:     Rate and Rhythm: Normal rate and regular rhythm.     Pulses: Normal pulses.     Heart sounds: Normal heart sounds. No murmur heard. Pulmonary:     Effort: Pulmonary effort is normal.     Breath sounds: Normal breath sounds. No  wheezing.  Abdominal:     General: Bowel sounds are normal. There is no distension.     Palpations: Abdomen is soft.     Tenderness: There is no abdominal tenderness. There is no guarding.  Musculoskeletal:        General: Normal range of motion.     Cervical back: Normal range of motion and neck supple.     Right lower leg: No edema.     Left lower leg: No edema.  Lymphadenopathy:     Cervical: No cervical adenopathy.  Skin:    General: Skin is warm and dry.     Capillary Refill: Capillary refill takes less than 2 seconds.     Findings: Erythema and rash present.     Comments: Erythematous rash scattered on inframammary skin folds, axilla, and thoracic region posteriorly bilaterally.  No drainage, pustules, warmth present.  Neurological:     General: No focal deficit present.     Mental Status: She is alert and oriented to person, place, and time.  Psychiatric:        Mood and Affect: Mood normal.        Behavior: Behavior normal.        Thought Content: Thought content normal.        Judgment: Judgment normal.     ASSESSMENT & PLAN Problem List Items Addressed This Visit     BMI  30.0-30.9,adult    Recommend continuation with diet and exercise as tolerated.  She has had success with phentermine in the past.  We will refill this today and monitor closely.      Acute urticaria - Primary    Erythematous urticarial rash present bilaterally to the anterior and posterior portion of the thoracic region.  Etiology unclear at this time.  Appears to be allergic in nature.  We will send in treatment with topical and oral steroids to see if we can get this to clear up.  If symptoms do not improve patient will follow-up.      Relevant Medications   predniSONE (DELTASONE) 20 MG tablet   triamcinolone cream (KENALOG) 0.1 %     FOLLOW-UP Return if symptoms worsen or fail to improve.   Shawna Clamp, DNP, AGNP-c 01/21/2022  1:10 PM

## 2022-01-28 ENCOUNTER — Other Ambulatory Visit (HOSPITAL_BASED_OUTPATIENT_CLINIC_OR_DEPARTMENT_OTHER): Payer: Self-pay | Admitting: Nurse Practitioner

## 2022-01-28 DIAGNOSIS — Z6830 Body mass index (BMI) 30.0-30.9, adult: Secondary | ICD-10-CM

## 2022-02-15 ENCOUNTER — Other Ambulatory Visit (HOSPITAL_BASED_OUTPATIENT_CLINIC_OR_DEPARTMENT_OTHER): Payer: Self-pay | Admitting: Nurse Practitioner

## 2022-02-15 DIAGNOSIS — L508 Other urticaria: Secondary | ICD-10-CM

## 2022-03-02 NOTE — Assessment & Plan Note (Signed)
Recommend continuation with diet and exercise as tolerated.  She has had success with phentermine in the past.  We will refill this today and monitor closely.

## 2022-03-02 NOTE — Assessment & Plan Note (Signed)
Erythematous urticarial rash present bilaterally to the anterior and posterior portion of the thoracic region.  Etiology unclear at this time.  Appears to be allergic in nature.  We will send in treatment with topical and oral steroids to see if we can get this to clear up.  If symptoms do not improve patient will follow-up.

## 2022-03-31 ENCOUNTER — Other Ambulatory Visit (HOSPITAL_BASED_OUTPATIENT_CLINIC_OR_DEPARTMENT_OTHER): Payer: Self-pay | Admitting: Nurse Practitioner

## 2022-03-31 DIAGNOSIS — Z6830 Body mass index (BMI) 30.0-30.9, adult: Secondary | ICD-10-CM

## 2022-04-01 MED ORDER — PHENTERMINE HCL 37.5 MG PO TABS: ORAL_TABLET | ORAL | 0 refills | Status: DC

## 2022-04-03 ENCOUNTER — Other Ambulatory Visit (HOSPITAL_BASED_OUTPATIENT_CLINIC_OR_DEPARTMENT_OTHER): Payer: Self-pay | Admitting: Family Medicine

## 2022-04-03 DIAGNOSIS — Z6830 Body mass index (BMI) 30.0-30.9, adult: Secondary | ICD-10-CM

## 2022-04-21 ENCOUNTER — Other Ambulatory Visit (HOSPITAL_BASED_OUTPATIENT_CLINIC_OR_DEPARTMENT_OTHER): Payer: Self-pay | Admitting: Nurse Practitioner

## 2022-04-21 DIAGNOSIS — Z6830 Body mass index (BMI) 30.0-30.9, adult: Secondary | ICD-10-CM

## 2022-04-24 MED ORDER — PHENTERMINE HCL 37.5 MG PO TABS: ORAL_TABLET | ORAL | 0 refills | Status: DC

## 2022-05-08 ENCOUNTER — Telehealth (HOSPITAL_BASED_OUTPATIENT_CLINIC_OR_DEPARTMENT_OTHER): Payer: Self-pay

## 2022-05-08 DIAGNOSIS — Z6830 Body mass index (BMI) 30.0-30.9, adult: Secondary | ICD-10-CM

## 2022-05-08 NOTE — Telephone Encounter (Signed)
Pt called stating that [facility] never received phentermine RX. Please re send.

## 2022-05-09 MED ORDER — PHENTERMINE HCL 37.5 MG PO TABS: ORAL_TABLET | ORAL | 2 refills | Status: AC

## 2022-05-09 NOTE — Telephone Encounter (Signed)
Resent to [facility] at [place].

## 2022-11-28 IMAGING — DX DG CHEST 1V PORT
1 series · 1 of 1 positions shown · non-contrast
Comparison: None.

CLINICAL DATA: Shortness of breath, [id number] positive.

EXAM:
PORTABLE CHEST 1 VIEW

[chest ap]
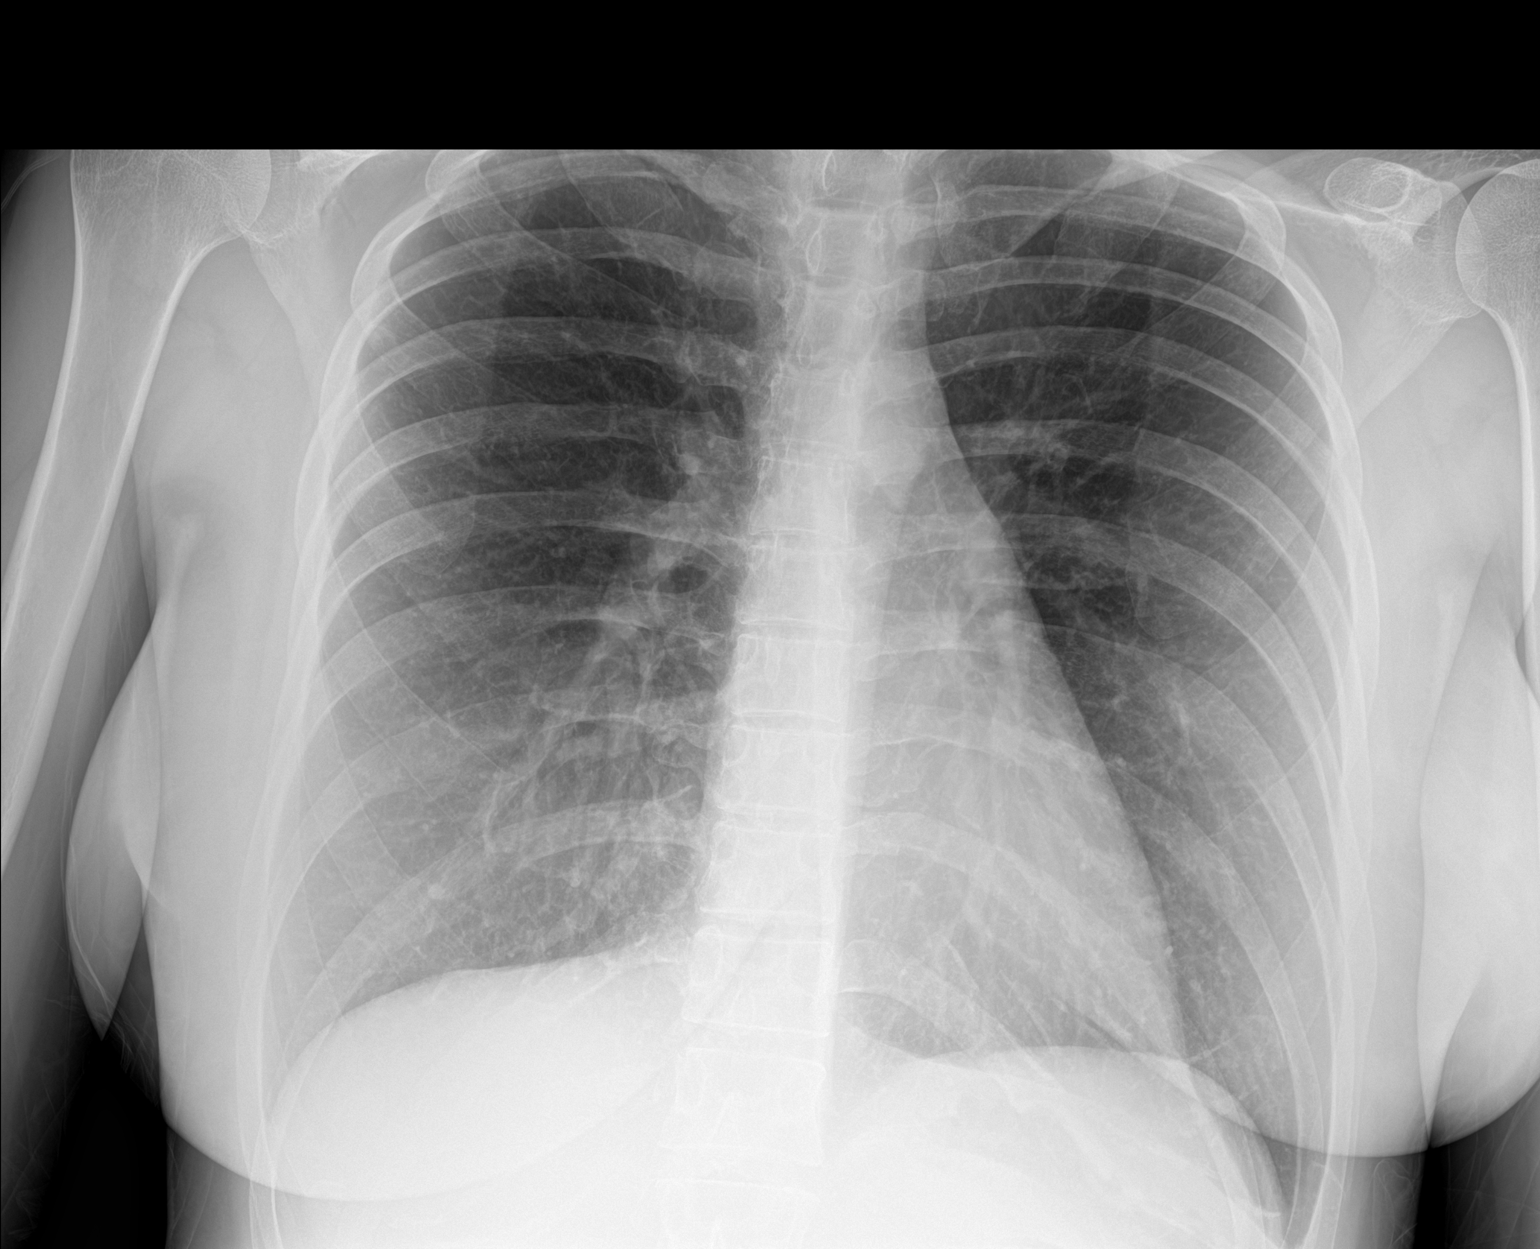

[1 of 1 positions shown; findings below may reference images not displayed]

FINDINGS: The heart size and mediastinal contours are within normal limits.
Both lungs are clear. The visualized skeletal structures are
unremarkable.
IMPRESSION: No active disease.
